# Patient Record
Sex: Female | Born: 1980
Health system: Southern US, Community
[De-identification: ages and names within clinical notes are randomized; demographics above are authoritative.]

## PROBLEM LIST (undated history)

## (undated) ENCOUNTER — Inpatient Hospital Stay (HOSPITAL_COMMUNITY): Payer: Self-pay

## (undated) DIAGNOSIS — O24419 Gestational diabetes mellitus in pregnancy, unspecified control: Secondary | ICD-10-CM

## (undated) DIAGNOSIS — O09529 Supervision of elderly multigravida, unspecified trimester: Secondary | ICD-10-CM

## (undated) DIAGNOSIS — Z789 Other specified health status: Secondary | ICD-10-CM

## (undated) HISTORY — DX: Gestational diabetes mellitus in pregnancy, unspecified control: O24.419

## (undated) HISTORY — DX: Supervision of elderly multigravida, unspecified trimester: O09.529

## (undated) HISTORY — PX: CHOLECYSTECTOMY: SHX55

---

## 2013-07-18 LAB — OB RESULTS CONSOLE RUBELLA ANTIBODY, IGM: Rubella: IMMUNE

## 2013-07-18 LAB — OB RESULTS CONSOLE HEPATITIS B SURFACE ANTIGEN: Hepatitis B Surface Ag: NEGATIVE

## 2013-07-18 LAB — OB RESULTS CONSOLE GC/CHLAMYDIA
Chlamydia: NEGATIVE
GC PROBE AMP, GENITAL: NEGATIVE

## 2013-07-18 LAB — OB RESULTS CONSOLE ABO/RH: RH Type: POSITIVE

## 2013-07-18 LAB — OB RESULTS CONSOLE ANTIBODY SCREEN: Antibody Screen: NEGATIVE

## 2013-07-18 LAB — OB RESULTS CONSOLE HIV ANTIBODY (ROUTINE TESTING): HIV: NONREACTIVE

## 2013-07-18 LAB — OB RESULTS CONSOLE RPR: RPR: NONREACTIVE

## 2013-07-24 ENCOUNTER — Inpatient Hospital Stay (HOSPITAL_COMMUNITY): Payer: BC Managed Care – PPO

## 2013-07-24 ENCOUNTER — Inpatient Hospital Stay (HOSPITAL_COMMUNITY)
Admission: AD | Admit: 2013-07-24 | Discharge: 2013-07-24 | Disposition: A | Discharge status: Home / Self Care | Payer: BC Managed Care – PPO | Source: Ambulatory Visit | Attending: Obstetrics & Gynecology | Admitting: Obstetrics & Gynecology

## 2013-07-24 ENCOUNTER — Encounter (HOSPITAL_COMMUNITY): Payer: Self-pay | Admitting: *Deleted

## 2013-07-24 DIAGNOSIS — O208 Other hemorrhage in early pregnancy: Secondary | ICD-10-CM | POA: Insufficient documentation

## 2013-07-24 DIAGNOSIS — R109 Unspecified abdominal pain: Secondary | ICD-10-CM | POA: Insufficient documentation

## 2013-07-24 DIAGNOSIS — M549 Dorsalgia, unspecified: Secondary | ICD-10-CM | POA: Insufficient documentation

## 2013-07-24 HISTORY — DX: Other specified health status: Z78.9

## 2013-07-24 LAB — CBC
MCH: 30.4 pg (ref 26.0–34.0)
Platelets: 260 10*3/uL (ref 150–400)
RBC: 3.65 MIL/uL — ABNORMAL LOW (ref 3.87–5.11)
RDW: 13.6 % (ref 11.5–15.5)
WBC: 8.3 10*3/uL (ref 4.0–10.5)

## 2013-07-24 LAB — URINE MICROSCOPIC-ADD ON

## 2013-07-24 LAB — URINALYSIS, ROUTINE W REFLEX MICROSCOPIC
Bilirubin Urine: NEGATIVE
Glucose, UA: NEGATIVE mg/dL
Leukocytes, UA: NEGATIVE
Nitrite: NEGATIVE
Specific Gravity, Urine: 1.015 (ref 1.005–1.030)
Urobilinogen, UA: 0.2 mg/dL (ref 0.0–1.0)
pH: 7 (ref 5.0–8.0)

## 2013-07-24 NOTE — MAU Note (Signed)
Bleeding started last night.  No blots, wearing a pad. Pain in rt side and low back

## 2013-07-24 NOTE — MAU Provider Note (Signed)
History     CSN: [id number]  Arrival date and time: 07/24/13 1002 Provider notified for order clarification: 0940 Provider on unit: 1240 Provider at bedside: 1245       Chief Complaint  Patient presents with  . Abdominal Pain  . Vaginal Bleeding   HPI  [name] is a 32 yo G3P2002 at 10.[redacted] wks gestation by ultrasound.  She presents today with complaints of bright, red bleeding since last night.  She reports soaking 1-2 pads this morning.  She became pregnant while on  Paraguard IUD; which has since been removed.  She has had 5 ultrasounds in this pregnancy with a dx'd [facility].   She has another ultrasound scheduled for 08/02/2013. Blood type is O+.  Past Medical History  Diagnosis Date  . Medical history non-contributory     Past Surgical History  Procedure Laterality Date  . No past surgeries      History reviewed. No pertinent family history.  History  Substance Use Topics  . Smoking status: Never Smoker   . Smokeless tobacco: Not on file  . Alcohol Use: No    Allergies: No Known Allergies  Prescriptions prior to admission  Medication Sig Dispense Refill  . acetaminophen (TYLENOL) 500 MG tablet Take 500 mg by mouth every 8 (eight) hours as needed for moderate pain.      . Prenatal Vit-Fe Fumarate-FA (PRENATAL MULTIVITAMIN) TABS tablet Take 1 tablet by mouth daily at 12 noon.        Review of Systems  Constitutional: Negative.   HENT: Negative.   Eyes: Negative.   Respiratory: Negative.   Cardiovascular: Negative.   Gastrointestinal: Negative.   Genitourinary: Negative.   Musculoskeletal: Negative.   Skin: Negative.   Neurological: Negative.   Endo/Heme/Allergies: Negative.   Psychiatric/Behavioral: Negative.    Results for orders placed during the hospital encounter of 07/24/13 (from the past 24 hour(s))  CBC     Status: Abnormal   Collection Time    07/24/13 10:15 AM      Result Value Range   WBC 8.3  4.0 - 10.5 K/uL   RBC 3.65 (*) 3.87 -  5.11 MIL/uL   Hemoglobin 11.1 (*) 12.0 - 15.0 g/dL   HCT 40.9 (*) 81.1 - 91.4 %   MCV 87.4  78.0 - 100.0 fL   MCH 30.4  26.0 - 34.0 pg   MCHC 34.8  30.0 - 36.0 g/dL   RDW 78.2  95.6 - 21.3 %   Platelets 260  150 - 400 K/uL  URINALYSIS, ROUTINE W REFLEX MICROSCOPIC     Status: Abnormal   Collection Time    07/24/13 10:18 AM      Result Value Range   Color, Urine YELLOW  YELLOW   APPearance CLEAR  CLEAR   Specific Gravity, Urine 1.015  1.005 - 1.030   pH 7.0  5.0 - 8.0   Glucose, UA NEGATIVE  NEGATIVE mg/dL   Hgb urine dipstick LARGE (*) NEGATIVE   Bilirubin Urine NEGATIVE  NEGATIVE   Ketones, ur NEGATIVE  NEGATIVE mg/dL   Protein, ur NEGATIVE  NEGATIVE mg/dL   Urobilinogen, UA 0.2  0.0 - 1.0 mg/dL   Nitrite NEGATIVE  NEGATIVE   Leukocytes, UA NEGATIVE  NEGATIVE  URINE MICROSCOPIC-ADD ON     Status: Abnormal   Collection Time    07/24/13 10:18 AM      Result Value Range   Squamous Epithelial / LPF FEW (*) RARE   RBC / HPF 0-2  <  3 RBC/hpf   Bacteria, UA RARE  RARE   US Ob Comp Less 14 Wks  07/24/2013   CLINICAL DATA:  Vaginal bleeding, back pain. Ten week 6 day pregnancy based on the last menstrual period.  EXAM: OBSTETRIC <14 WK ULTRASOUND  TECHNIQUE: Transabdominal ultrasound was performed for evaluation of the gestation as well as the maternal uterus and adnexal regions.  COMPARISON:  None.  FINDINGS: Intrauterine gestational sac: Visualized/normal in shape.  Yolk sac:  Visualized  Embryo:  Visualized  Cardiac Activity: Visualize  Heart Rate: 160 bpm  MSD:   mm    w     d  CRL:   46.1  mm   11 w 3 d                  [place] EDC: 02/09/2014  Maternal uterus/adnexae: Small subchorionic hemorrhage. Ovaries symmetric in size and echotexture. No adnexal masses. No free fluid.  IMPRESSION: Ten week 6 day pregnancy by last menstrual period. Fetal heart rate 160 beats per min. No acute maternal findings.   Electronically Signed   By: Charlett Nose M.D.   On: 07/24/2013 11:53   Physical Exam    Blood pressure 116/58, pulse 70, temperature 98.1 F (36.7 C), temperature source Oral, resp. rate 18, height 5' (1.524 m), weight 67.586 kg (149 lb), last menstrual period 05/04/2013.  Physical Exam  Constitutional: She is oriented to person, place, and time. She appears well-developed and well-nourished.  HENT:  Head: Normocephalic and atraumatic.  Eyes: EOM are normal. Pupils are equal, round, and reactive to light.  Neck: Normal range of motion. Neck supple.  Cardiovascular: Normal rate, regular rhythm and normal heart sounds.   Respiratory: Effort normal and breath sounds normal.  GI: Soft. Bowel sounds are normal.  Genitourinary: Vagina normal.  Musculoskeletal: Normal range of motion.  Neurological: She is alert and oriented to person, place, and time.  Skin: Skin is warm and dry.  Psychiatric: She has a normal mood and affect. Her behavior is normal. Judgment and thought content normal.    MAU Course  Procedures CBC OB US <14wks  Assessment and Plan  32 yo IUP @ 10.6 wks Subchorionic Hemorrhage - stable Back Pain  Bleeding Precautions reviewed with patient Instructed to call back if bleeding  Tylenol 1000 mg every 8 hours prn pain Keep scheduled appointment 08/02/2013  *Dr. Cherly Hensen notified of plan - agrees Kenard Gower, MSN, CNM 07/24/2013, 12:59 PM

## 2013-08-01 NOTE — L&D Delivery Note (Signed)
Delivery Note At 3:34 PM a viable female was delivered via  (Presentation: DOA ;  ).  APGAR:per nursing notes , ; weight .  pending Placenta status: spontaneous, complete , .  Cord: 3 VC with the following complications: none.  Cord pH: not indicated  Anesthesia:  none Episiotomy: no Lacerations: 1st degreee Suture Repair: 3.0 vicryl rapide - 2 figure of 8 sutures  Est. Blood Loss (mL): 300cc  Mom to postpartum.  Baby to Couplet care / Skin to Skin.  Nancy Campbell A. 02/07/2014, 3:47 PM

## 2013-11-27 ENCOUNTER — Encounter: Payer: BC Managed Care – PPO | Attending: Obstetrics

## 2013-11-27 VITALS — Ht 62.0 in | Wt 177.1 lb

## 2013-11-27 DIAGNOSIS — Z713 Dietary counseling and surveillance: Secondary | ICD-10-CM | POA: Insufficient documentation

## 2013-11-27 DIAGNOSIS — O99810 Abnormal glucose complicating pregnancy: Secondary | ICD-10-CM | POA: Insufficient documentation

## 2013-11-28 NOTE — Progress Notes (Signed)
  Patient was seen on 11/27/13 for Gestational Diabetes self-management class at the [facility]. The following learning objectives were met by the patient during this course:   States the definition of Gestational Diabetes  States why dietary management is important in controlling blood glucose  Describes the effects of carbohydrates on blood glucose levels  Demonstrates ability to create a balanced meal plan  Demonstrates carbohydrate counting   States when to check blood glucose levels  Demonstrates proper blood glucose monitoring techniques  States the effect of stress and exercise on blood glucose levels  States the importance of limiting caffeine and abstaining from alcohol and smoking  Plan:  Aim for 2 Carb Choices per meal (30 grams) +/- 1 either way for breakfast Aim for 3 Carb Choices per meal (45 grams) +/- 1 either way from lunch and dinner Aim for 1-2 Carbs per snack Begin reading food labels for Total Carbohydrate and sugar grams of foods Consider  increasing your activity level by walking daily as tolerated Begin checking BG before breakfast and 1-2 hours after first bit of breakfast, lunch and dinner after  as directed by MD  Take medication  as directed by MD  Blood glucose monitor given:  One Touch Ultra Mini Self Monitoring Kit Lot # [id number] X Exp:05/2014 Blood glucose reading: 69m/dl  Patient instructed to monitor glucose levels: FBS: 60 - <90 2 hour: <120  Patient received the following handouts:  Nutrition Diabetes and Pregnancy  Carbohydrate Counting List  Meal Planning worksheet  Patient will be seen for follow-up as needed.

## 2014-01-16 LAB — OB RESULTS CONSOLE GBS: GBS: NEGATIVE

## 2014-02-03 ENCOUNTER — Encounter (HOSPITAL_COMMUNITY): Payer: Self-pay | Admitting: *Deleted

## 2014-02-03 ENCOUNTER — Telehealth (HOSPITAL_COMMUNITY): Payer: Self-pay | Admitting: *Deleted

## 2014-02-03 NOTE — Telephone Encounter (Signed)
Preadmission screen  

## 2014-02-04 ENCOUNTER — Other Ambulatory Visit: Payer: Self-pay | Admitting: Obstetrics

## 2014-02-07 ENCOUNTER — Encounter (HOSPITAL_COMMUNITY): Payer: Self-pay

## 2014-02-07 ENCOUNTER — Inpatient Hospital Stay (HOSPITAL_COMMUNITY)
Admission: RE | Admit: 2014-02-07 | Discharge: 2014-02-09 | DRG: 775 | Disposition: A | Discharge status: Home / Self Care | Payer: BC Managed Care – PPO | Source: Ambulatory Visit | Attending: Obstetrics | Admitting: Obstetrics

## 2014-02-07 DIAGNOSIS — O24429 Gestational diabetes mellitus in childbirth, unspecified control: Secondary | ICD-10-CM | POA: Diagnosis not present

## 2014-02-07 DIAGNOSIS — O99814 Abnormal glucose complicating childbirth: Principal | ICD-10-CM | POA: Diagnosis present

## 2014-02-07 DIAGNOSIS — O9902 Anemia complicating childbirth: Secondary | ICD-10-CM | POA: Diagnosis present

## 2014-02-07 DIAGNOSIS — O09529 Supervision of elderly multigravida, unspecified trimester: Secondary | ICD-10-CM | POA: Diagnosis present

## 2014-02-07 DIAGNOSIS — Z8249 Family history of ischemic heart disease and other diseases of the circulatory system: Secondary | ICD-10-CM

## 2014-02-07 DIAGNOSIS — Z3490 Encounter for supervision of normal pregnancy, unspecified, unspecified trimester: Secondary | ICD-10-CM

## 2014-02-07 DIAGNOSIS — D649 Anemia, unspecified: Secondary | ICD-10-CM | POA: Diagnosis present

## 2014-02-07 LAB — CBC
HCT: 33.2 % — ABNORMAL LOW (ref 36.0–46.0)
Hemoglobin: 11.4 g/dL — ABNORMAL LOW (ref 12.0–15.0)
MCH: 31.8 pg (ref 26.0–34.0)
MCHC: 34.3 g/dL (ref 30.0–36.0)
MCV: 92.5 fL (ref 78.0–100.0)
Platelets: 182 10*3/uL (ref 150–400)
RBC: 3.59 MIL/uL — AB (ref 3.87–5.11)
RDW: 14.4 % (ref 11.5–15.5)
WBC: 8.3 10*3/uL (ref 4.0–10.5)

## 2014-02-07 LAB — RPR

## 2014-02-07 LAB — ABO/RH: ABO/RH(D): O POS

## 2014-02-07 LAB — TYPE AND SCREEN
ABO/RH(D): O POS
Antibody Screen: NEGATIVE

## 2014-02-07 LAB — GLUCOSE, CAPILLARY
Glucose-Capillary: 82 mg/dL (ref 70–99)
Glucose-Capillary: 93 mg/dL (ref 70–99)

## 2014-02-07 MED ORDER — TETANUS-DIPHTH-ACELL PERTUSSIS 5-2.5-18.5 LF-MCG/0.5 IM SUSP: 0.5000 mL | Freq: Once | INTRAMUSCULAR | Status: DC

## 2014-02-07 MED ORDER — IBUPROFEN 600 MG PO TABS
600.0000 mg | ORAL_TABLET | Freq: Four times a day (QID) | ORAL | Status: DC | PRN
  Administered 2014-02-07: 600 mg via ORAL
  Filled 2014-02-07: qty 1

## 2014-02-07 MED ORDER — OXYTOCIN BOLUS FROM INFUSION: 500.0000 mL | INTRAVENOUS | Status: DC

## 2014-02-07 MED ORDER — CITRIC ACID-SODIUM CITRATE 334-500 MG/5ML PO SOLN: 30.0000 mL | ORAL | Status: DC | PRN

## 2014-02-07 MED ORDER — LACTATED RINGERS IV SOLN
INTRAVENOUS | Status: DC
  Administered 2014-02-07: 1000 mL via INTRAVENOUS

## 2014-02-07 MED ORDER — FLEET ENEMA 7-19 GM/118ML RE ENEM: 1.0000 | ENEMA | Freq: Every day | RECTAL | Status: DC | PRN

## 2014-02-07 MED ORDER — OXYCODONE-ACETAMINOPHEN 5-325 MG PO TABS: 1.0000 | ORAL_TABLET | ORAL | Status: DC | PRN

## 2014-02-07 MED ORDER — ZOLPIDEM TARTRATE 5 MG PO TABS: 5.0000 mg | ORAL_TABLET | Freq: Every evening | ORAL | Status: DC | PRN

## 2014-02-07 MED ORDER — SIMETHICONE 80 MG PO CHEW: 80.0000 mg | CHEWABLE_TABLET | ORAL | Status: DC | PRN

## 2014-02-07 MED ORDER — LIDOCAINE HCL (PF) 1 % IJ SOLN
30.0000 mL | INTRAMUSCULAR | Status: DC | PRN
  Filled 2014-02-07: qty 30

## 2014-02-07 MED ORDER — BUTORPHANOL TARTRATE 1 MG/ML IJ SOLN
1.0000 mg | INTRAMUSCULAR | Status: DC | PRN
  Administered 2014-02-07: 2 mg via INTRAVENOUS
  Filled 2014-02-07: qty 2

## 2014-02-07 MED ORDER — OXYTOCIN 40 UNITS IN LACTATED RINGERS INFUSION - SIMPLE MED
1.0000 m[IU]/min | INTRAVENOUS | Status: DC
  Administered 2014-02-07: 8 m[IU]/min via INTRAVENOUS
  Administered 2014-02-07: 10 m[IU]/min via INTRAVENOUS
  Administered 2014-02-07: 4 m[IU]/min via INTRAVENOUS
  Administered 2014-02-07: 8 m[IU]/min via INTRAVENOUS
  Administered 2014-02-07: 6 m[IU]/min via INTRAVENOUS
  Administered 2014-02-07: 2 m[IU]/min via INTRAVENOUS
  Filled 2014-02-07: qty 1000

## 2014-02-07 MED ORDER — TERBUTALINE SULFATE 1 MG/ML IJ SOLN: 0.2500 mg | Freq: Once | INTRAMUSCULAR | Status: DC | PRN

## 2014-02-07 MED ORDER — ACETAMINOPHEN 325 MG PO TABS: 650.0000 mg | ORAL_TABLET | ORAL | Status: DC | PRN

## 2014-02-07 MED ORDER — SENNOSIDES-DOCUSATE SODIUM 8.6-50 MG PO TABS
2.0000 | ORAL_TABLET | ORAL | Status: DC
  Administered 2014-02-07 – 2014-02-08 (×2): 2 via ORAL
  Filled 2014-02-07 (×2): qty 2

## 2014-02-07 MED ORDER — BENZOCAINE-MENTHOL 20-0.5 % EX AERO
1.0000 | INHALATION_SPRAY | CUTANEOUS | Status: DC | PRN
Start: 2014-02-07 — End: 2014-02-09
  Administered 2014-02-07: 1 via TOPICAL
  Filled 2014-02-07: qty 56

## 2014-02-07 MED ORDER — LANOLIN HYDROUS EX OINT: TOPICAL_OINTMENT | CUTANEOUS | Status: DC | PRN

## 2014-02-07 MED ORDER — IBUPROFEN 600 MG PO TABS
600.0000 mg | ORAL_TABLET | Freq: Four times a day (QID) | ORAL | Status: DC
  Administered 2014-02-07 – 2014-02-09 (×7): 600 mg via ORAL
  Filled 2014-02-07 (×7): qty 1

## 2014-02-07 MED ORDER — OXYTOCIN 40 UNITS IN LACTATED RINGERS INFUSION - SIMPLE MED
62.5000 mL/h | INTRAVENOUS | Status: DC
Start: 2014-02-07 — End: 2014-02-07

## 2014-02-07 MED ORDER — WITCH HAZEL-GLYCERIN EX PADS
1.0000 "application " | MEDICATED_PAD | CUTANEOUS | Status: DC | PRN
  Administered 2014-02-08: 1 via TOPICAL

## 2014-02-07 MED ORDER — LACTATED RINGERS IV SOLN
500.0000 mL | INTRAVENOUS | Status: DC | PRN
Start: 2014-02-07 — End: 2014-02-07

## 2014-02-07 MED ORDER — OXYCODONE-ACETAMINOPHEN 5-325 MG PO TABS
1.0000 | ORAL_TABLET | ORAL | Status: DC | PRN
  Administered 2014-02-07 – 2014-02-09 (×4): 1 via ORAL
  Filled 2014-02-07 (×4): qty 1

## 2014-02-07 MED ORDER — DIPHENHYDRAMINE HCL 25 MG PO CAPS: 25.0000 mg | ORAL_CAPSULE | Freq: Four times a day (QID) | ORAL | Status: DC | PRN

## 2014-02-07 MED ORDER — ONDANSETRON HCL 4 MG PO TABS: 4.0000 mg | ORAL_TABLET | ORAL | Status: DC | PRN

## 2014-02-07 MED ORDER — BISACODYL 10 MG RE SUPP: 10.0000 mg | Freq: Every day | RECTAL | Status: DC | PRN

## 2014-02-07 MED ORDER — PRENATAL MULTIVITAMIN CH
1.0000 | ORAL_TABLET | Freq: Every day | ORAL | Status: DC
  Administered 2014-02-08 – 2014-02-09 (×2): 1 via ORAL
  Filled 2014-02-07 (×2): qty 1

## 2014-02-07 MED ORDER — ONDANSETRON HCL 4 MG/2ML IJ SOLN: 4.0000 mg | INTRAMUSCULAR | Status: DC | PRN

## 2014-02-07 MED ORDER — ONDANSETRON HCL 4 MG/2ML IJ SOLN: 4.0000 mg | Freq: Four times a day (QID) | INTRAMUSCULAR | Status: DC | PRN

## 2014-02-07 MED ORDER — DIBUCAINE 1 % RE OINT
1.0000 "application " | TOPICAL_OINTMENT | RECTAL | Status: DC | PRN
  Administered 2014-02-08: 1 via RECTAL
  Filled 2014-02-07 (×2): qty 28

## 2014-02-07 NOTE — H&P (Signed)
Nancy Campbell is a 33 y.o. G3P2002 at 534w1d presenting for IOL because of gestational diabete . Pt notes rare  contractions. Good fetal movement, No vaginal bleeding, not  leaking fluid   [facility] - Unplanned pregnancy. Conceived on an IUD which was removed at [redacted] weeks gestation - Subchorionic hemorrhage 5 cm at 12 weeks with early first trimester vaginal bleeding - Marginal cord insertion - Early fetal bilateral pyelectasis which resolved at 26 weeks -Anemia, on iron - Gestational diabetes. On glyburide 7.5 mg every morning, 5 mg every afternoon. Patient was compliant with 4 times a day blood sugar checks and third trimester fetal surveillance which remained reassuring - Group B strep negative - EFW 84th percentile at 35 weeks    Prenatal Transfer Tool  Maternal Diabetes: Yes:  Diabetes Type:  Insulin/Medication controlled Genetic Screening: Normal Maternal Ultrasounds/Referrals: Normal Fetal Ultrasounds or other Referrals:  None Maternal Substance Abuse:  No Significant Maternal Medications:  None Significant Maternal Lab Results: None     OB History   Grav Para Term Preterm Abortions TAB SAB Ect Mult Living   3 2 2       2      Past Medical History  Diagnosis Date  . Medical history non-contributory   . Gestational diabetes mellitus, antepartum   . AMA (advanced maternal age) multigravida 35+    Past Surgical History  Procedure Laterality Date  . Cholecystectomy     Family History: family history includes Cancer in her father; Hypertension in her mother. Social History:  reports that she has never smoked. She does not have any smokeless tobacco history on file. She reports that she does not drink alcohol or use illicit drugs.  Review of Systems - Negative except Discomfort of pregnancy and occasional contractions   Dilation: Fingertip Effacement (%): Thick Station: -3 Exam by:: Dherr rn Blood pressure 108/73, pulse 59, temperature  98.5 F (36.9 C), temperature source Oral, resp. rate 20, height 5\' 1"  (1.549 m), weight 78.472 kg (173 lb), last menstrual period 05/04/2013.  Physical Exam:  Gen: well appearing, no distress CV: RRR Pulm: CTAB Back: no CVAT Abd: gravid, NT, no RUQ pain LE: trace edema, equal bilaterally, non-tender   Prenatal labs: ABO, Rh: O/Positive/-- (12/18 0000) Antibody: Negative (12/18 0000) Rubella:  immune RPR: Nonreactive (12/18 0000)  HBsAg: Negative (12/18 0000)  HIV: Non-reactive (12/18 0000)  GBS: Negative (06/18 0000)  1 hr Glucola 140  Genetic screening nl NT, nl AFP Anatomy US nl   Assessment/Plan: 33 y.o. [id number] at 594w1d Gestational diabetes would recommend induction of labor now at 39 weeks Group B strep negative Planning IV pain medicines    Briselda Naval A. 02/07/2014, 11:22 AM

## 2014-02-08 LAB — GLUCOSE, CAPILLARY: Glucose-Capillary: 135 mg/dL — ABNORMAL HIGH (ref 70–99)

## 2014-02-08 NOTE — Progress Notes (Signed)
PPD #1- SVD  Subjective:   Reports feeling well, cramping with BF Tolerating po/ No nausea or vomiting Bleeding is light Pain controlled with Motrin Up ad lib / ambulatory / voiding without problems Newborn: breastfeeding  / Circumcision: unsure   Objective:   VS:  VS:  Filed Vitals:   02/07/14 1746 02/07/14 1910 02/07/14 2245 02/08/14 0508  BP: 91/55 93/52 98/58  109/70  Pulse: 58 80 66 65  Temp: 98.4 F (36.9 C) 98.6 F (37 C) 98.4 F (36.9 C) 98.5 F (36.9 C)  TempSrc: Oral Oral Oral Oral  Resp: 16 16 16 18   Height:      Weight:      SpO2:    97%    LABS:  Recent Labs  02/07/14 0724  WBC 8.3  HGB 11.4*  PLT 182   Blood type: --/--/O POS, O POS (07/10 0724) Rubella: Immune (12/18 0000)   I&O: Intake/Output     07/10 0701 - 07/11 0700 07/11 0701 - 07/12 0700   Blood 300    Total Output 300     Net -300            Physical Exam: Alert and oriented x3 Abdomen: soft, non-tender, non-distended  Fundus: firm, non-tender, 1/U-suspect distended bladder Perineum: Well approximated, no significant erythema, edema, or drainage; healing well. Lochia: small Extremities: No edema, no calf pain or tenderness    Assessment:  PPD # 1G3P3003/ S/P:induced vaginal, 1st degree laceration A2GDM, delivered  Doing well    Plan: Check FBS in am Percocet prn pain b/w Motrin doses Empty bladder frequently Continue routine post partum orders Anticipate D/C home tomorrow   Donette LarryBHAMBRI, Alayziah Tangeman, N MSN, CNM 02/08/2014, 9:52 AM

## 2014-02-08 NOTE — Lactation Note (Signed)
This note was copied from the chart of Nancy NigeriaLoubna Reis. Lactation Consultation Note P-3, BF her 1st & 2nd child for over a yr. Each. They are 5118yrs. Old and 9 yrs. Old. When I entered rm. Pt. Had baby swaddled, in craddle position, feeding on end of nipple. Baby on its back turning head to feed. BF basics reviewed, how feeding a newborn is slightly different than feeding an older child. Encouraged good body alignment and STS. Mom stated she couldn't latch on Rt. Nipple. Mom has LE edema. Everted nipples, Rt. Does slightly inverts when compressed. Able to obtain good latch in football positioning. Mom keeps pulling back on breast pulling nipple back. Encouraged not to do that she can break the latch. Needed reminded several times. Has good colostrum. Hand expression taught. Heard audible swallows.  Patient Name: Nancy Campbell ZOXWR'UToday's Date: 02/08/2014 Reason for consult: Initial assessment Mom encouraged to feed baby 8-12 times/24 hours and with feeding cues. Mom encouraged to feed baby w/feeding cues. WH/LC brochure given w/resources, support groups and LC services. Educated about newborn behavior. Encouraged to call for assistance if needed and to verify proper latch.Reviewed Baby & Me book's Breastfeeding Basics.    Maternal Data    Feeding Feeding Type: Breast Fed Length of feed: 10 min (still feeding)  LATCH Score/Interventions Latch: Grasps breast easily, tongue down, lips flanged, rhythmical sucking. Intervention(s): Skin to skin;Teach feeding cues;Waking techniques Intervention(s): Adjust position;Assist with latch;Breast massage;Breast compression  Audible Swallowing: A few with stimulation Intervention(s): Skin to skin;Hand expression Intervention(s): Skin to skin;Hand expression;Alternate breast massage  Type of Nipple: Everted at rest and after stimulation  Comfort (Breast/Nipple): Filling, red/small blisters or bruises, mild/mod discomfort  Problem noted:  Mild/Moderate discomfort Interventions (Mild/moderate discomfort): Hand massage;Hand expression  Hold (Positioning): Assistance needed to correctly position infant at breast and maintain latch. Intervention(s): Breastfeeding basics reviewed;Support Pillows;Position options;Skin to skin  LATCH Score: 7  Lactation Tools Discussed/Used     Consult Status Consult Status: Follow-up Date: 02/09/14 Follow-up type: In-patient    Charyl DancerCARVER, Breniyah Romm G 02/08/2014, 9:58 AM

## 2014-02-08 NOTE — Lactation Note (Signed)
This note was copied from the chart of Nancy Campbell. Lactation Consultation Note Comfort gels given for soreness, encourage to express colostrum and rub on nipples and obtain deep latch. Cheek to breast. Patient Name: Nancy Jonna ClarkLoubna Todt ZOXWR'UToday's Date: 02/08/2014 Reason for consult: Initial assessment   Maternal Data    Feeding Feeding Type: Breast Fed Length of feed: 10 min (still feeding)  LATCH Score/Interventions Latch: Grasps breast easily, tongue down, lips flanged, rhythmical sucking. Intervention(s): Skin to skin;Teach feeding cues;Waking techniques Intervention(s): Adjust position;Assist with latch;Breast massage;Breast compression  Audible Swallowing: A few with stimulation Intervention(s): Skin to skin;Hand expression Intervention(s): Skin to skin;Hand expression;Alternate breast massage  Type of Nipple: Everted at rest and after stimulation  Comfort (Breast/Nipple): Filling, red/small blisters or bruises, mild/mod discomfort  Problem noted: Mild/Moderate discomfort Interventions (Mild/moderate discomfort): Hand massage;Hand expression  Hold (Positioning): Assistance needed to correctly position infant at breast and maintain latch. Intervention(s): Breastfeeding basics reviewed;Support Pillows;Position options;Skin to skin  LATCH Score: 7  Lactation Tools Discussed/Used     Consult Status Consult Status: Follow-up Date: 02/09/14 Follow-up type: In-patient    Charyl DancerCARVER, Starlee Corralejo G 02/08/2014, 10:25 AM

## 2014-02-09 ENCOUNTER — Ambulatory Visit: Payer: Self-pay

## 2014-02-09 DIAGNOSIS — O24429 Gestational diabetes mellitus in childbirth, unspecified control: Secondary | ICD-10-CM | POA: Diagnosis not present

## 2014-02-09 LAB — GLUCOSE, CAPILLARY: GLUCOSE-CAPILLARY: 81 mg/dL (ref 70–99)

## 2014-02-09 MED ORDER — OXYCODONE-ACETAMINOPHEN 5-325 MG PO TABS: 1.0000 | ORAL_TABLET | ORAL | Status: DC | PRN

## 2014-02-09 MED ORDER — IBUPROFEN 600 MG PO TABS: 600.0000 mg | ORAL_TABLET | Freq: Four times a day (QID) | ORAL | Status: AC

## 2014-02-09 NOTE — Progress Notes (Signed)
PPD #2- SVD  Subjective:   Reports feeling well Tolerating po/ No nausea or vomiting Bleeding is light Pain controlled with Motrin and Percocet Up ad lib / ambulatory / voiding without problems Newborn: breastfeeding  / Circumcision: undecided   Objective:   VS: VS:  Filed Vitals:   02/07/14 2245 02/08/14 0508 02/08/14 1801 02/09/14 0547  BP: 98/58 109/70 101/62 96/56  Pulse: 66 65 61 62  Temp: 98.4 F (36.9 C) 98.5 F (36.9 C) 98.2 F (36.8 C) 98 F (36.7 C)  TempSrc: Oral Oral Oral Oral  Resp: 16 18 18 18   Height:      Weight:      SpO2:  97%      LABS:  Recent Labs  02/07/14 0724  WBC 8.3  HGB 11.4*  PLT 182   Blood type: --/--/O POS, O POS (07/10 0724) Rubella: Immune (12/18 0000)                I&O: Intake/Output     07/11 0701 - 07/12 0700 07/12 0701 - 07/13 0700   Blood     Total Output       Net              Physical Exam: Alert and oriented X3 Abdomen: soft, non-tender, non-distended  Fundus: firm, non-tender, U-1 Perineum: Well approximated, no significant erythema, edema, or drainage; healing well. Lochia: small Extremities: Trace BLE edema, no calf pain or tenderness    Assessment: PPD # 2 G3P3003/ S/P:induced vaginal, 1st degree laceration A2GDM, delivered-stable Doing well - stable for discharge home   Plan: Discharge home RX's:  Ibuprofen 600mg  po Q 6 hrs prn pain #30 Refill x 0 Percocet 5/325 1 to 2 po Q 4 hrs prn pain #30 Refill x 0 Routine pp visit in 6wks and glucose test 6-12 weeks [facility] booklet given    Donette LarryBHAMBRI, Noelene Gang, N MSN, CNM 02/09/2014, 10:50 AM

## 2014-02-09 NOTE — Lactation Note (Signed)
This note was copied from the chart of Nancy Campbell. Lactation Consultation Note  Baby just returned from circ and sleeping. Mother states her nipples are sore.  Reviewed applying ebm and has comfort gels on. Encouraged deep latch, lots of STS. Mother denies questions or problems. Encouraged mother to call if she needs further assistance.   Patient Name: Nancy Campbell ZOXWR'UToday's Date: 02/09/2014 Reason for consult: Follow-up assessment   Maternal Data Has patient been taught Hand Expression?: Yes  Feeding Feeding Type: Breast Fed Length of feed: 15 min  LATCH Score/Interventions Latch: Grasps breast easily, tongue down, lips flanged, rhythmical sucking. Intervention(s): Adjust position  Audible Swallowing: A few with stimulation  Type of Nipple: Everted at rest and after stimulation  Comfort (Breast/Nipple): Soft / non-tender  Problem noted: Mild/Moderate discomfort  Hold (Positioning): Assistance needed to correctly position infant at breast and maintain latch.  LATCH Score: 8  Lactation Tools Discussed/Used     Consult Status Consult Status: Follow-up Date: 02/03/14 Follow-up type: In-patient    Dahlia ByesBerkelhammer, Ruth [facility] 02/09/2014, 3:12 PM

## 2014-02-09 NOTE — Discharge Summary (Signed)
Obstetric Discharge Summary Reason for Admission: induction of labor Prenatal Course: first trimester SCH, marginal CI, fetal pyelectasis, anemia, A2GDM Intrapartum Procedures: spontaneous vaginal delivery and Pitocin, and epidural Postpartum Procedures: none Complications-Operative and Postpartum: first degree perineal laceration Hemoglobin  Date Value Ref Range Status  02/07/2014 11.4* 12.0 - 15.0 g/dL Final     HCT  Date Value Ref Range Status  02/07/2014 33.2* 36.0 - 46.0 % Final    Physical Exam:  General: alert and cooperative Lochia: appropriate Uterine Fundus: firm Incision: healing well, no significant drainage, no dehiscence, no significant erythema DVT Evaluation: No evidence of DVT seen on physical exam. Negative Homan's sign. No cords or calf tenderness. No significant calf/ankle edema.  Discharge Diagnoses: Term Pregnancy-delivered and A2GDM, delivered  Discharge Information: Date: 02/09/2014 Activity: pelvic rest Diet: routine Medications: PNV, Ibuprofen and Percocet Condition: stable Instructions: refer to practice specific booklet Discharge to: home Follow-up Information   [facility] Schedule an appointment as soon as possible for a visit in 6 weeks. (and glucose test in 6-12 weeks at office)    Specialty:  Obstetrics and Gynecology   Contact information:   [address] [place] [id number] [phone]       Newborn Data: Live born female on 02/07/14 Birth Weight: 7 lb 12 oz (3515 g) APGAR: 8, 9 Remains inpatient for observation   Abigaile Rossie, N 02/09/2014, 11:37 AM

## 2014-02-10 ENCOUNTER — Ambulatory Visit: Payer: Self-pay

## 2014-02-10 NOTE — Lactation Note (Signed)
This note was copied from the chart of Nancy Campbell. Lactation Consultation Note  Patient Name: Nancy Jonna ClarkLoubna Riede RUEAV'WToday's Date: 02/10/2014 Reason for consult: Follow-up assessment Mom reports baby is nursing well, denies questions or concerns. Engorgement care reviewed if needed. Advised of OP services.  Maternal Data    Feeding Feeding Type: Breast Milk Length of feed: 10 min  LATCH Score/Interventions                      Lactation Tools Discussed/Used     Consult Status Consult Status: Complete Date: 02/10/14 Follow-up type: In-patient    Alfred LevinsGranger, Ashlin Kreps Ann 02/10/2014, 10:33 AM

## 2014-06-02 ENCOUNTER — Encounter (HOSPITAL_COMMUNITY): Payer: Self-pay

## 2015-04-29 IMAGING — US US OB COMP LESS 14 WK
1 series · 14 of 28 positions shown · non-contrast
Comparison: None.

CLINICAL DATA: Vaginal bleeding, back pain. Ten week 6 day
pregnancy based on the last menstrual period.

EXAM:
OBSTETRIC <14 WK ULTRASOUND
TECHNIQUE: Transabdominal ultrasound was performed for evaluation of the
gestation as well as the maternal uterus and adnexal regions.

[Series 1: us ob comp less 14 wks · 30 acquisitions, 14 frames shown]
[im 2/30]
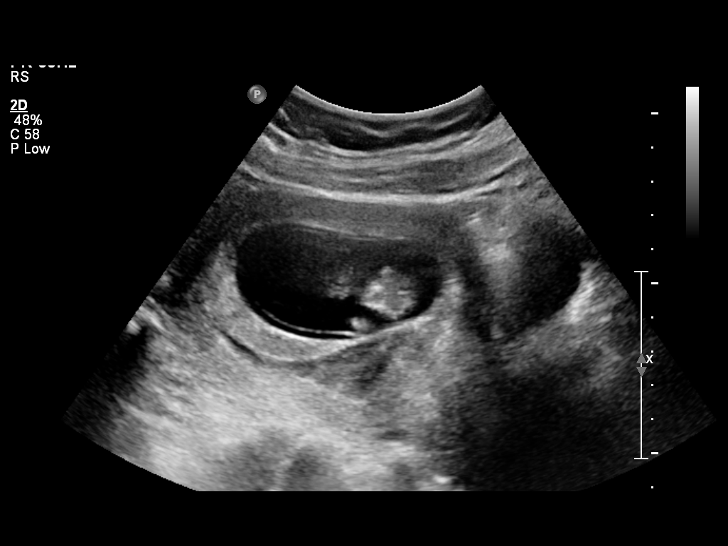
[im 4/30]
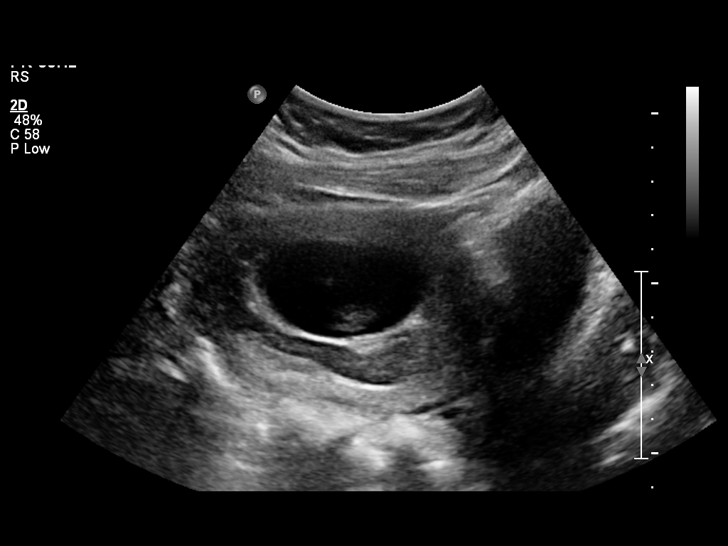
[im 6/30]
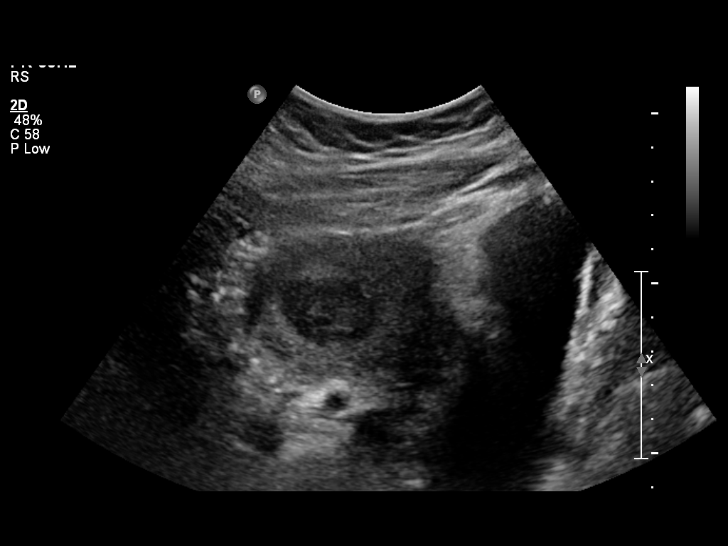
[im 8/30]
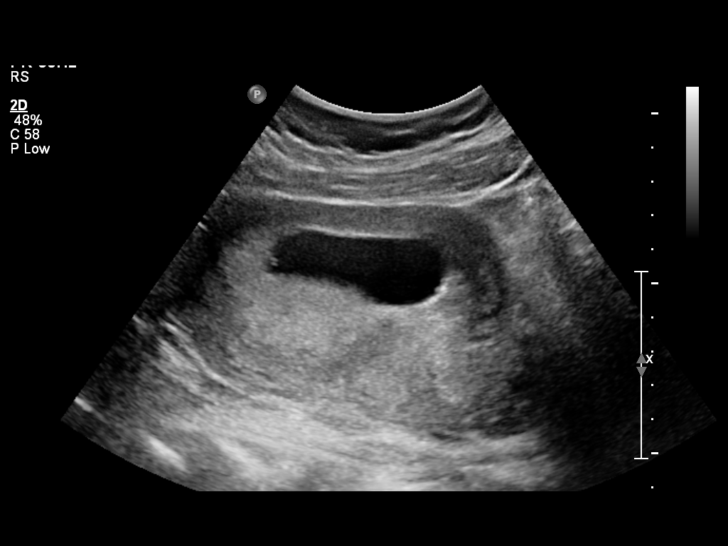
[im 10/30]
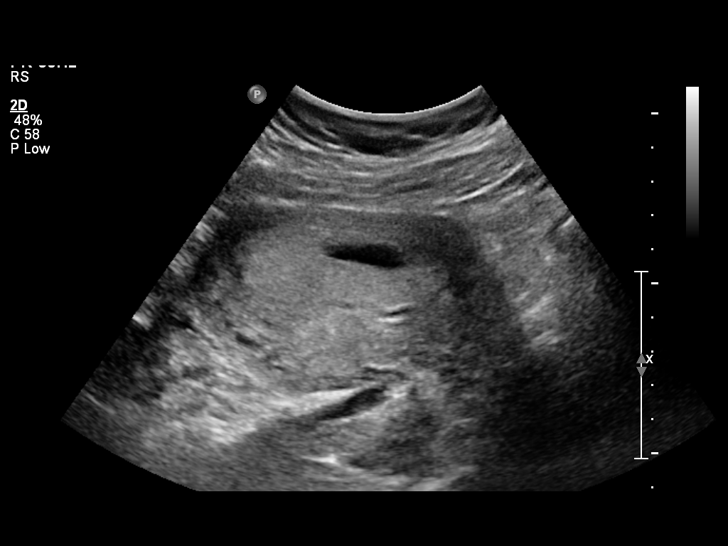
[im 12/30]
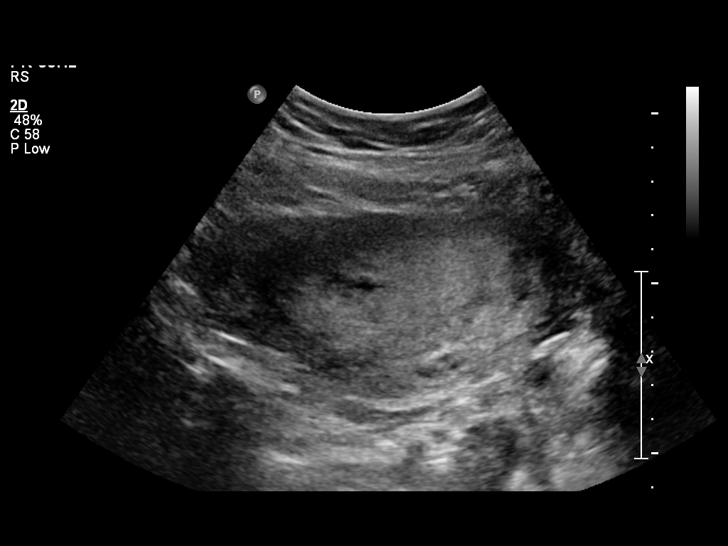
[im 14/30]
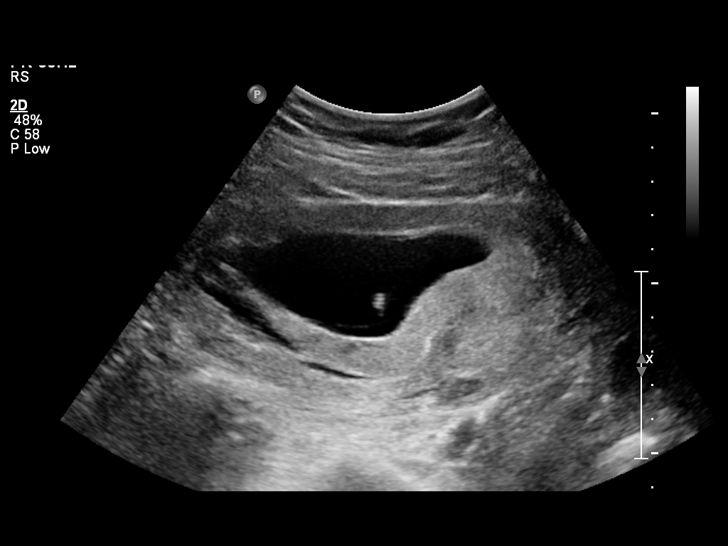
[im 17/30]
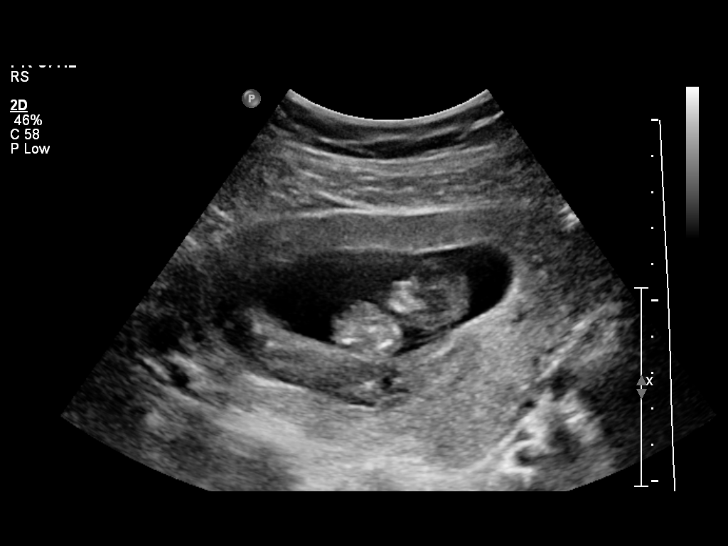
[im 19/30]
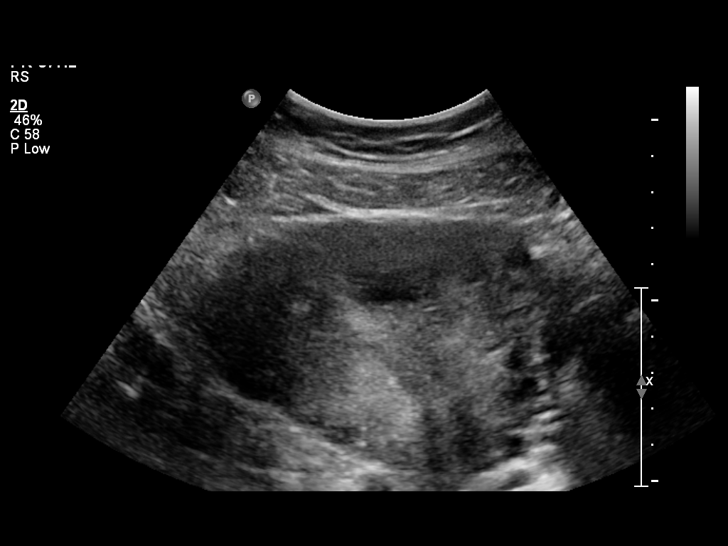
[im 21/30]
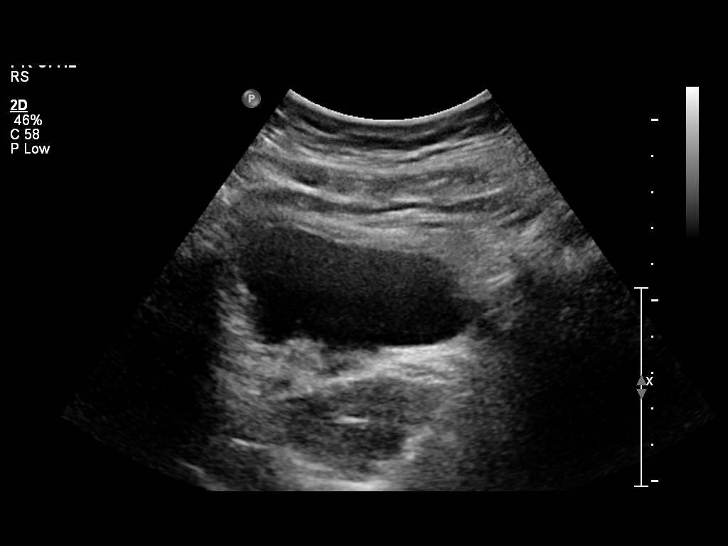
[im 23/30]
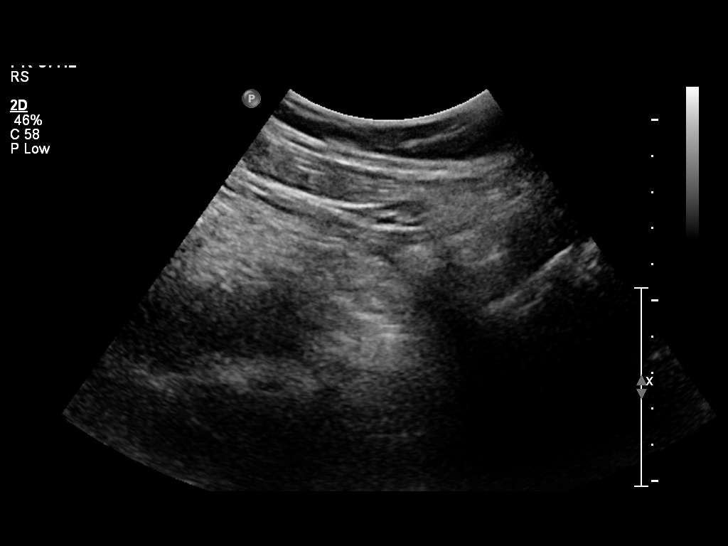
[im 25/30]
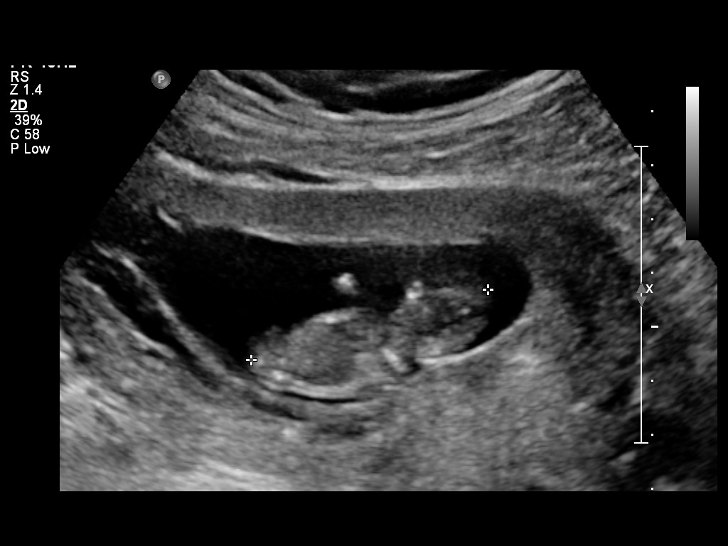
[im 27/30]
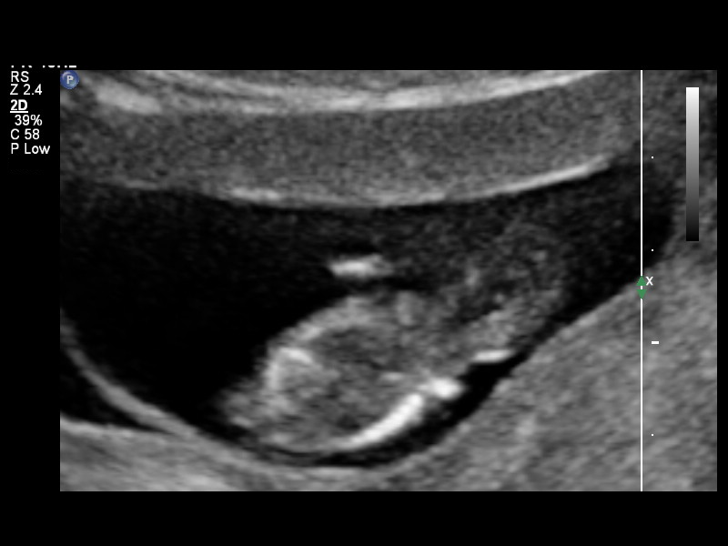
[im 30/30]
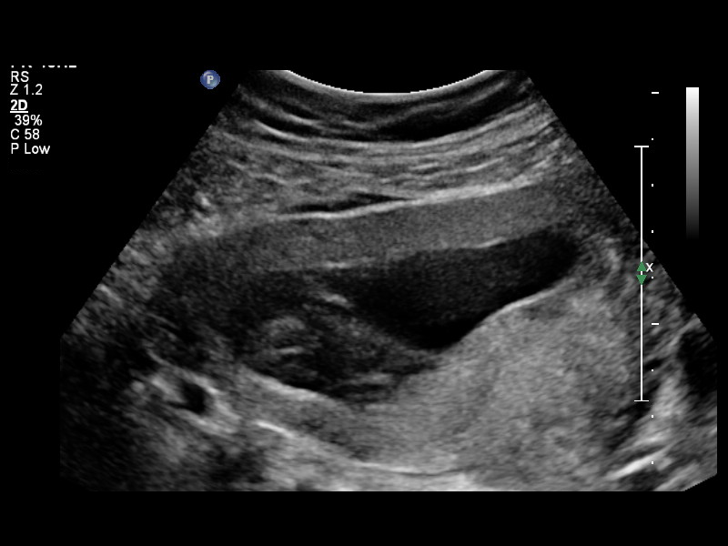

[14 of 28 positions shown; findings below may reference images not displayed]

FINDINGS: Intrauterine gestational sac: Visualized/normal in shape.

Yolk sac:  Visualized

Embryo:  Visualized

Cardiac Activity: Visualize

Heart Rate: 160 bpm

MSD:   mm    w     d

CRL:   46.1  mm   11 w 3 d                  US EDC: 02/09/2014

Maternal uterus/adnexae: Small subchorionic hemorrhage. Ovaries
symmetric in size and echotexture. No adnexal masses. No free fluid.
IMPRESSION: Ten week 6 day pregnancy by last menstrual period. Fetal heart rate
160 beats per min. No acute maternal findings.

## 2015-11-05 DIAGNOSIS — Z6823 Body mass index (BMI) 23.0-23.9, adult: Secondary | ICD-10-CM | POA: Diagnosis not present

## 2015-11-05 DIAGNOSIS — J019 Acute sinusitis, unspecified: Secondary | ICD-10-CM | POA: Diagnosis not present

## 2015-12-07 DIAGNOSIS — R42 Dizziness and giddiness: Secondary | ICD-10-CM | POA: Diagnosis not present

## 2015-12-07 DIAGNOSIS — Z6824 Body mass index (BMI) 24.0-24.9, adult: Secondary | ICD-10-CM | POA: Diagnosis not present

## 2015-12-15 DIAGNOSIS — Z30430 Encounter for insertion of intrauterine contraceptive device: Secondary | ICD-10-CM | POA: Diagnosis not present

## 2015-12-15 DIAGNOSIS — Z3202 Encounter for pregnancy test, result negative: Secondary | ICD-10-CM | POA: Diagnosis not present

## 2016-02-03 DIAGNOSIS — I499 Cardiac arrhythmia, unspecified: Secondary | ICD-10-CM | POA: Diagnosis not present

## 2016-02-15 DIAGNOSIS — L292 Pruritus vulvae: Secondary | ICD-10-CM | POA: Diagnosis not present

## 2016-02-15 DIAGNOSIS — Z30431 Encounter for routine checking of intrauterine contraceptive device: Secondary | ICD-10-CM | POA: Diagnosis not present

## 2016-02-15 DIAGNOSIS — N760 Acute vaginitis: Secondary | ICD-10-CM | POA: Diagnosis not present

## 2016-02-15 DIAGNOSIS — B373 Candidiasis of vulva and vagina: Secondary | ICD-10-CM | POA: Diagnosis not present

## 2016-02-16 DIAGNOSIS — I499 Cardiac arrhythmia, unspecified: Secondary | ICD-10-CM | POA: Diagnosis not present

## 2016-04-05 DIAGNOSIS — M940 Chondrocostal junction syndrome [Tietze]: Secondary | ICD-10-CM | POA: Diagnosis not present

## 2016-04-05 DIAGNOSIS — R0789 Other chest pain: Secondary | ICD-10-CM | POA: Diagnosis not present

## 2016-04-05 DIAGNOSIS — F419 Anxiety disorder, unspecified: Secondary | ICD-10-CM | POA: Diagnosis not present

## 2016-04-05 DIAGNOSIS — Z6824 Body mass index (BMI) 24.0-24.9, adult: Secondary | ICD-10-CM | POA: Diagnosis not present

## 2016-05-16 DIAGNOSIS — E663 Overweight: Secondary | ICD-10-CM | POA: Diagnosis not present

## 2016-05-16 DIAGNOSIS — R1011 Right upper quadrant pain: Secondary | ICD-10-CM | POA: Diagnosis not present

## 2016-05-16 DIAGNOSIS — Z6826 Body mass index (BMI) 26.0-26.9, adult: Secondary | ICD-10-CM | POA: Diagnosis not present

## 2016-05-20 DIAGNOSIS — R1011 Right upper quadrant pain: Secondary | ICD-10-CM | POA: Diagnosis not present

## 2016-05-31 ENCOUNTER — Encounter: Payer: Self-pay | Admitting: Gastroenterology

## 2016-05-31 ENCOUNTER — Encounter: Payer: Self-pay | Admitting: Physician Assistant

## 2016-05-31 ENCOUNTER — Ambulatory Visit (INDEPENDENT_AMBULATORY_CARE_PROVIDER_SITE_OTHER): Payer: Self-pay | Admitting: Physician Assistant

## 2016-05-31 VITALS — BP 96/56 | HR 77 | Ht 61.0 in | Wt 138.0 lb

## 2016-05-31 DIAGNOSIS — R1013 Epigastric pain: Secondary | ICD-10-CM | POA: Diagnosis not present

## 2016-05-31 DIAGNOSIS — M25511 Pain in right shoulder: Secondary | ICD-10-CM

## 2016-05-31 DIAGNOSIS — R1011 Right upper quadrant pain: Secondary | ICD-10-CM | POA: Diagnosis not present

## 2016-05-31 NOTE — Progress Notes (Signed)
Agree with assessment and plan as outlined.  

## 2016-05-31 NOTE — Patient Instructions (Addendum)
You have been scheduled for an endoscopy. Please follow written instructions given to you at your visit today. If you use inhalers (even only as needed), please bring them with you on the day of your procedure.   

## 2016-05-31 NOTE — Progress Notes (Signed)
Chief Complaint: RUQ pain  HPI:  Mrs. Nancy Campbell is a 35 year old female, who was referred to me by Hamrick, Durward FortesMaura L, MD for a complaint of right upper quadrant abdominal pain .  The patient has never been seen in our clinic before.  Per chart review patient had a recent right upper quadrant ultrasound completed on 05/20/16 which showed no gallstones or wall thickening. No sonographic Murphy sign. Common bile duct measured 5 mm within normal limits and there was no lesion on the liver.  Today, the patient presents to clinic accompanied by her 82-1/25-year-old son and husband, her husband does assist with history. The patient describes that 2 months ago she started with a right upper quadrant pain which occurs intermittently throughout the day and is unassociated with eating. She cannot recall exactly when this started or if there may have been a proceeding incident. When this starts it tends to be a 9-10/10 in severity and will radiate through to her back. It lasts for 15-20 minutes. This pain also wakes her from sleep. She describes that sometimes it "twitches like someone is poking me from the inside". This is somewhat worse if the patient lays on her left side to sleep at night and somewhat better if she lays on her right side. Nothing else seems to affect the pain.   Past medical history is significant for diagnosis of costochondritis about a month or so ago for which the patient used high-dose NSAIDs for a period of 10 days.  Patient's social history is positive for having a 1012-1/25-year-old son whom she does pick up on a daily basis, but she cannot remember a specific instance where she may have injured her shoulder  Patient denies nausea, vomiting, heartburn, reflux, dysphagia, increasing gas or bloating, change in bowel habits, fever, chills, blood in her stool, melena, change in diet, change in medications, recent increase in physical activity, weight loss, fatigue or anorexia.   Past Medical  History:  Diagnosis Date  . AMA (advanced maternal age) multigravida 35+   . Gestational diabetes mellitus, antepartum   . Medical history non-contributory     Past surgical history: None  Current Outpatient Prescriptions  Medication Sig Dispense Refill  . ibuprofen (ADVIL,MOTRIN) 600 MG tablet Take 1 tablet (600 mg total) by mouth every 6 (six) hours. 30 tablet 0  . Prenatal Vit-Fe Fumarate-FA (PRENATAL MULTIVITAMIN) TABS tablet Take 1 tablet by mouth daily at 12 noon.     No current facility-administered medications for this visit.     Allergies as of 05/31/2016  . (No Known Allergies)    Family History  Problem Relation Age of Onset  . Hypertension Mother   . Cancer Father     lung    Social History   Social History  . Marital status: Married    Spouse name: N/A  . Number of children: N/A  . Years of education: N/A   Occupational History  . Not on file.   Social History Main Topics  . Smoking status: Never Smoker  . Smokeless tobacco: Not on file  . Alcohol use No  . Drug use: No  . Sexual activity: Yes   Other Topics Concern  . Not on file   Social History Narrative  . No narrative on file    Review of Systems:     Constitutional: No weight loss, fever, chills, weakness or fatigue HEENT: Eyes: No change in vision  Ears, Nose, Throat:  No change in hearing or congestion Skin: No rash or itching Cardiovascular: No chest pain, chest pressure or palpitations   Respiratory: No SOB or cough Gastrointestinal: See HPI and otherwise negative Genitourinary: No dysuria or change in urinary frequency Neurological: No headache, dizziness or syncope Musculoskeletal: No new muscle or joint pain Hematologic: No bleeding or bruising Psychiatric: Positive for history of depression No history of anxiety    Physical Exam:  Vital signs: BP (!) 96/56   Pulse 77   Ht 5\' 1"  (1.549 m)   Wt 138 lb (62.6 kg)   BMI 26.07 kg/m   General:   Pleasant  Female appears to be in NAD, Well developed, Well nourished, alert and cooperative Head:  Normocephalic and atraumatic. Eyes:   PEERL, EOMI. No icterus. Conjunctiva pink. Ears:  Normal auditory acuity. Neck:  Supple Throat: Oral cavity and pharynx without inflammation, swelling or lesion. Teeth in good condition. Lungs: Respirations even and unlabored. Lungs clear to auscultation bilaterally.   No wheezes, crackles, or rhonchi.  Heart: Normal S1, S2. No MRG. Regular rate and rhythm. No peripheral edema, cyanosis or pallor.  Abdomen:  Soft, nondistended, mild epigastric/right upper quadrant tenderness to palpation. No rebound or guarding. Normal bowel sounds. No appreciable masses or hepatomegaly. Rectal:  Not performed.  Msk:  Symmetrical without gross deformities. Peripheral pulses intact. Patient tender over medial aspect of right shoulder blade upon palpation, worsening of pain with manipulation of her arm Extremities:  Without edema, no deformity or joint abnormality. Normal ROM, normal sensation. Neurologic:  Alert and  oriented x4;  grossly normal neurologically. CN II-XII intact.  Skin:   Dry and intact without significant lesions or rashes. Psychiatric: Oriented to person, place and time. Demonstrates good judgement and reason without abnormal affect or behaviors.  No recent labs or imaging  Assessment: 1. Right upper quadrant/epigastric pain: Started 2 months ago, worse at night while patient lays on her left side, unrelated to eating, no heartburn, reflux, nausea or vomiting; history of high-dose NSAID use a month ago, the patient tells me she was having this pain before ;consider gastritis versus upper GI etiology versus gallbladder versed musculoskeletal 2. Right shoulder pain: Initially patient told me this is related to times when she was in severe right upper quadrant abdominal pain, though on exam she is tender over her shoulder blade and her tenderness is worse when she  manipulated her arm and shoulder, believe this is musculoskeletal in origin, would not recommend NSAIDs at this time as there is discussion about a pop possible upper GI etiology for abdominal pain  Plan: 1. Discussed options for further evaluation with the patient and her husband. We discussed that she could have an EGD for further evaluation of a possible gastritis/upper GI etiology. She agreed to proceed. Discussed risks, benefits, limitations and alternatives. This is scheduled with Dr. Adela LankArmbruster. 2. If above is unrevealing, could consider further labs and possibly a HIDA scan. Patient did decline an empiric trial of low-dose PPI. 3. Discussed with the patient and her family that I believe her shoulder pain is musculoskeletal in origin. She should follow with her PCP regarding this and/or see a sports medicine doctor. 4. Patient to follow in clinic with Dr. Adela LankArmbruster per his recommendations after time of EGD.  Nancy MeekerJennifer Calel Pisarski, PA-C [facility] 05/31/2016, 11:28 AM  Cc: Ailene RavelHamrick, Maura L, MD

## 2016-06-14 ENCOUNTER — Encounter: Payer: BLUE CROSS/BLUE SHIELD | Admitting: Gastroenterology

## 2016-08-25 DIAGNOSIS — Z6828 Body mass index (BMI) 28.0-28.9, adult: Secondary | ICD-10-CM | POA: Diagnosis not present

## 2016-08-25 DIAGNOSIS — Z1389 Encounter for screening for other disorder: Secondary | ICD-10-CM | POA: Diagnosis not present

## 2016-08-25 DIAGNOSIS — Z2821 Immunization not carried out because of patient refusal: Secondary | ICD-10-CM | POA: Diagnosis not present

## 2016-08-25 DIAGNOSIS — M2528 Flail joint, other site: Secondary | ICD-10-CM | POA: Diagnosis not present

## 2016-11-29 DIAGNOSIS — R875 Abnormal microbiological findings in specimens from female genital organs: Secondary | ICD-10-CM | POA: Diagnosis not present

## 2016-11-29 DIAGNOSIS — Z0000 Encounter for general adult medical examination without abnormal findings: Secondary | ICD-10-CM | POA: Diagnosis not present

## 2016-11-29 DIAGNOSIS — N760 Acute vaginitis: Secondary | ICD-10-CM | POA: Diagnosis not present

## 2016-11-29 DIAGNOSIS — Z1151 Encounter for screening for human papillomavirus (HPV): Secondary | ICD-10-CM | POA: Diagnosis not present

## 2016-11-29 DIAGNOSIS — B373 Candidiasis of vulva and vagina: Secondary | ICD-10-CM | POA: Diagnosis not present

## 2016-11-29 DIAGNOSIS — Z01419 Encounter for gynecological examination (general) (routine) without abnormal findings: Secondary | ICD-10-CM | POA: Diagnosis not present

## 2016-11-29 DIAGNOSIS — Z6827 Body mass index (BMI) 27.0-27.9, adult: Secondary | ICD-10-CM | POA: Diagnosis not present

## 2016-11-29 DIAGNOSIS — Z130 Encounter for screening for diseases of the blood and blood-forming organs and certain disorders involving the immune mechanism: Secondary | ICD-10-CM | POA: Diagnosis not present

## 2016-11-29 DIAGNOSIS — L659 Nonscarring hair loss, unspecified: Secondary | ICD-10-CM | POA: Diagnosis not present

## 2016-11-29 DIAGNOSIS — Z1329 Encounter for screening for other suspected endocrine disorder: Secondary | ICD-10-CM | POA: Diagnosis not present

## 2016-11-29 DIAGNOSIS — N898 Other specified noninflammatory disorders of vagina: Secondary | ICD-10-CM | POA: Diagnosis not present

## 2016-11-29 DIAGNOSIS — Z131 Encounter for screening for diabetes mellitus: Secondary | ICD-10-CM | POA: Diagnosis not present

## 2016-12-06 DIAGNOSIS — L659 Nonscarring hair loss, unspecified: Secondary | ICD-10-CM | POA: Diagnosis not present

## 2016-12-06 DIAGNOSIS — R7309 Other abnormal glucose: Secondary | ICD-10-CM | POA: Diagnosis not present

## 2016-12-06 DIAGNOSIS — R7989 Other specified abnormal findings of blood chemistry: Secondary | ICD-10-CM | POA: Diagnosis not present

## 2017-01-18 DIAGNOSIS — H04122 Dry eye syndrome of left lacrimal gland: Secondary | ICD-10-CM | POA: Diagnosis not present

## 2017-03-06 DIAGNOSIS — F331 Major depressive disorder, recurrent, moderate: Secondary | ICD-10-CM | POA: Diagnosis not present

## 2017-03-08 DIAGNOSIS — R7989 Other specified abnormal findings of blood chemistry: Secondary | ICD-10-CM | POA: Diagnosis not present

## 2017-03-08 DIAGNOSIS — R7309 Other abnormal glucose: Secondary | ICD-10-CM | POA: Diagnosis not present

## 2017-05-03 DIAGNOSIS — Z6826 Body mass index (BMI) 26.0-26.9, adult: Secondary | ICD-10-CM | POA: Diagnosis not present

## 2017-05-03 DIAGNOSIS — Z111 Encounter for screening for respiratory tuberculosis: Secondary | ICD-10-CM | POA: Diagnosis not present

## 2017-05-03 DIAGNOSIS — Z0289 Encounter for other administrative examinations: Secondary | ICD-10-CM | POA: Diagnosis not present

## 2017-05-03 DIAGNOSIS — E663 Overweight: Secondary | ICD-10-CM | POA: Diagnosis not present

## 2017-05-12 DIAGNOSIS — F4323 Adjustment disorder with mixed anxiety and depressed mood: Secondary | ICD-10-CM | POA: Diagnosis not present

## 2017-05-12 DIAGNOSIS — Z6823 Body mass index (BMI) 23.0-23.9, adult: Secondary | ICD-10-CM | POA: Diagnosis not present

## 2017-07-27 DIAGNOSIS — F411 Generalized anxiety disorder: Secondary | ICD-10-CM | POA: Diagnosis not present

## 2017-09-06 DIAGNOSIS — M5430 Sciatica, unspecified side: Secondary | ICD-10-CM | POA: Diagnosis not present

## 2017-09-06 DIAGNOSIS — F411 Generalized anxiety disorder: Secondary | ICD-10-CM | POA: Diagnosis not present

## 2017-10-27 DIAGNOSIS — L659 Nonscarring hair loss, unspecified: Secondary | ICD-10-CM | POA: Diagnosis not present

## 2017-10-27 DIAGNOSIS — O24419 Gestational diabetes mellitus in pregnancy, unspecified control: Secondary | ICD-10-CM | POA: Diagnosis not present

## 2017-10-27 DIAGNOSIS — Z0000 Encounter for general adult medical examination without abnormal findings: Secondary | ICD-10-CM | POA: Diagnosis not present

## 2017-10-27 DIAGNOSIS — Z136 Encounter for screening for cardiovascular disorders: Secondary | ICD-10-CM | POA: Diagnosis not present

## 2017-10-27 DIAGNOSIS — M5430 Sciatica, unspecified side: Secondary | ICD-10-CM | POA: Diagnosis not present

## 2017-10-27 DIAGNOSIS — Z131 Encounter for screening for diabetes mellitus: Secondary | ICD-10-CM | POA: Diagnosis not present

## 2018-01-17 DIAGNOSIS — M79672 Pain in left foot: Secondary | ICD-10-CM | POA: Diagnosis not present

## 2018-01-23 ENCOUNTER — Encounter: Payer: Self-pay | Admitting: Sports Medicine

## 2018-01-23 ENCOUNTER — Ambulatory Visit: Payer: BLUE CROSS/BLUE SHIELD | Admitting: Sports Medicine

## 2018-01-23 ENCOUNTER — Ambulatory Visit (INDEPENDENT_AMBULATORY_CARE_PROVIDER_SITE_OTHER): Payer: BLUE CROSS/BLUE SHIELD

## 2018-01-23 DIAGNOSIS — M79672 Pain in left foot: Secondary | ICD-10-CM

## 2018-01-23 DIAGNOSIS — M779 Enthesopathy, unspecified: Secondary | ICD-10-CM

## 2018-01-23 MED ORDER — DICLOFENAC SODIUM 75 MG PO TBEC: 75.0000 mg | DELAYED_RELEASE_TABLET | Freq: Two times a day (BID) | ORAL | 0 refills | Status: AC

## 2018-01-23 MED ORDER — METHYLPREDNISOLONE 4 MG PO TBPK: ORAL_TABLET | ORAL | 0 refills | Status: AC

## 2018-01-23 NOTE — Progress Notes (Signed)
Subjective: Nancy Campbell is a 37 y.o. female patient who presents to office for evaluation of left foot pain. Patient complains of progressive pain especially over the last month in the left foot at medial arch. Ranks pain 5/10 and is now interferring with daily activities with swelling. Patient has tried massage with no relief in symptoms. Patient admits hip and thigh pain as well. Patient denies any other pedal complaints. Denies injury/trip/fall/sprain/any causative factors.   Review of Systems  Musculoskeletal: Positive for joint pain and myalgias.  All other systems reviewed and are negative.  Patient Active Problem List   Diagnosis Date Noted  . Gestational diabetes mellitus, delivered 02/09/2014  . Normal pregnancy 02/07/2014  . Postpartum care following vaginal delivery (7/10) 02/07/2014    Current Outpatient Medications on File Prior to Visit  Medication Sig Dispense Refill  . ibuprofen (ADVIL,MOTRIN) 600 MG tablet Take 1 tablet (600 mg total) by mouth every 6 (six) hours. 30 tablet 0  . Prenatal Vit-Fe Fumarate-FA (PRENATAL MULTIVITAMIN) TABS tablet Take 1 tablet by mouth daily at 12 noon.     No current facility-administered medications on file prior to visit.     No Known Allergies  Objective:  General: Alert and oriented x3 in no acute distress  Dermatology: No open lesions bilateral lower extremities, no webspace macerations, no ecchymosis bilateral, all nails x 10 are well manicured.  Vascular: Dorsalis Pedis and Posterior Tibial pedal pulses palpable, Capillary Fill Time 3 seconds,(+) pedal hair growth bilateral, no edema bilateral lower extremities, Temperature gradient within normal limits.  Neurology: Michaell CowingGross sensation intact via light touch bilateral.   Musculoskeletal: Mild tenderness with palpation at posterior tibial tendon on left foot. Strength within normal limits in all groups bilateral.   Gait: Antalgic gait  Xrays  Left Foot   Impression:Mild  soft tissue swelling. No other acute findings.   Assessment and Plan: Problem List Items Addressed This Visit    None    Visit Diagnoses    Tendonitis    -  Primary   Relevant Medications   methylPREDNISolone (MEDROL DOSEPAK) 4 MG TBPK tablet   diclofenac (VOLTAREN) 75 MG EC tablet   Foot pain, left       Relevant Orders   DG Foot Complete Left (Completed)      -Complete examination performed -Xrays reviewed -Discussed treatement options for PT tendonitis -Rx Medrol and diclofenac -Dispensed compression sleeve  -Dispensed ankle gaunlet  -Patient to return to office as needed in 4 weeks if pain is not better or sooner if condition worsens.  Asencion Islamitorya Sajad Glander, DPM

## 2018-05-17 DIAGNOSIS — B373 Candidiasis of vulva and vagina: Secondary | ICD-10-CM | POA: Diagnosis not present

## 2018-06-07 DIAGNOSIS — Z124 Encounter for screening for malignant neoplasm of cervix: Secondary | ICD-10-CM | POA: Diagnosis not present

## 2018-06-07 DIAGNOSIS — Z113 Encounter for screening for infections with a predominantly sexual mode of transmission: Secondary | ICD-10-CM | POA: Diagnosis not present

## 2018-06-07 DIAGNOSIS — Z1151 Encounter for screening for human papillomavirus (HPV): Secondary | ICD-10-CM | POA: Diagnosis not present

## 2018-06-07 DIAGNOSIS — Z6824 Body mass index (BMI) 24.0-24.9, adult: Secondary | ICD-10-CM | POA: Diagnosis not present

## 2018-06-07 DIAGNOSIS — Z01419 Encounter for gynecological examination (general) (routine) without abnormal findings: Secondary | ICD-10-CM | POA: Diagnosis not present

## 2018-06-18 DIAGNOSIS — R1031 Right lower quadrant pain: Secondary | ICD-10-CM | POA: Diagnosis not present

## 2018-06-18 DIAGNOSIS — Z975 Presence of (intrauterine) contraceptive device: Secondary | ICD-10-CM | POA: Diagnosis not present

## 2018-07-31 DIAGNOSIS — Y838 Other surgical procedures as the cause of abnormal reaction of the patient, or of later complication, without mention of misadventure at the time of the procedure: Secondary | ICD-10-CM | POA: Diagnosis not present

## 2018-07-31 DIAGNOSIS — T8332XA Displacement of intrauterine contraceptive device, initial encounter: Secondary | ICD-10-CM | POA: Diagnosis not present

## 2018-07-31 DIAGNOSIS — T8332XD Displacement of intrauterine contraceptive device, subsequent encounter: Secondary | ICD-10-CM | POA: Diagnosis not present

## 2019-05-28 DIAGNOSIS — D509 Iron deficiency anemia, unspecified: Secondary | ICD-10-CM | POA: Diagnosis not present

## 2019-05-28 DIAGNOSIS — E559 Vitamin D deficiency, unspecified: Secondary | ICD-10-CM | POA: Diagnosis not present

## 2019-05-28 DIAGNOSIS — R7303 Prediabetes: Secondary | ICD-10-CM | POA: Diagnosis not present

## 2019-05-28 DIAGNOSIS — Z0000 Encounter for general adult medical examination without abnormal findings: Secondary | ICD-10-CM | POA: Diagnosis not present

## 2019-05-28 DIAGNOSIS — Z111 Encounter for screening for respiratory tuberculosis: Secondary | ICD-10-CM | POA: Diagnosis not present

## 2019-05-28 DIAGNOSIS — Z13220 Encounter for screening for lipoid disorders: Secondary | ICD-10-CM | POA: Diagnosis not present

## 2019-10-30 DIAGNOSIS — M5432 Sciatica, left side: Secondary | ICD-10-CM | POA: Diagnosis not present

## 2019-10-30 DIAGNOSIS — Z6827 Body mass index (BMI) 27.0-27.9, adult: Secondary | ICD-10-CM | POA: Diagnosis not present

## 2019-11-11 DIAGNOSIS — Z30011 Encounter for initial prescription of contraceptive pills: Secondary | ICD-10-CM | POA: Diagnosis not present

## 2019-11-11 DIAGNOSIS — Z6827 Body mass index (BMI) 27.0-27.9, adult: Secondary | ICD-10-CM | POA: Diagnosis not present

## 2019-12-23 DIAGNOSIS — Z8669 Personal history of other diseases of the nervous system and sense organs: Secondary | ICD-10-CM | POA: Diagnosis not present

## 2019-12-23 DIAGNOSIS — H903 Sensorineural hearing loss, bilateral: Secondary | ICD-10-CM | POA: Diagnosis not present

## 2019-12-23 DIAGNOSIS — H9319 Tinnitus, unspecified ear: Secondary | ICD-10-CM | POA: Diagnosis not present

## 2020-01-05 DIAGNOSIS — Z1152 Encounter for screening for COVID-19: Secondary | ICD-10-CM | POA: Diagnosis not present

## 2020-01-05 DIAGNOSIS — Z20822 Contact with and (suspected) exposure to covid-19: Secondary | ICD-10-CM | POA: Diagnosis not present

## 2020-03-10 DIAGNOSIS — Z111 Encounter for screening for respiratory tuberculosis: Secondary | ICD-10-CM | POA: Diagnosis not present

## 2020-03-30 DIAGNOSIS — M2170 Unequal limb length (acquired), unspecified site: Secondary | ICD-10-CM | POA: Diagnosis not present

## 2020-03-30 DIAGNOSIS — R7303 Prediabetes: Secondary | ICD-10-CM | POA: Diagnosis not present

## 2020-03-30 DIAGNOSIS — E559 Vitamin D deficiency, unspecified: Secondary | ICD-10-CM | POA: Diagnosis not present

## 2020-03-30 DIAGNOSIS — D509 Iron deficiency anemia, unspecified: Secondary | ICD-10-CM | POA: Diagnosis not present

## 2020-03-30 DIAGNOSIS — R5383 Other fatigue: Secondary | ICD-10-CM | POA: Diagnosis not present

## 2020-04-02 DIAGNOSIS — M419 Scoliosis, unspecified: Secondary | ICD-10-CM | POA: Diagnosis not present

## 2020-04-02 DIAGNOSIS — M5432 Sciatica, left side: Secondary | ICD-10-CM | POA: Diagnosis not present

## 2020-04-02 DIAGNOSIS — M5137 Other intervertebral disc degeneration, lumbosacral region: Secondary | ICD-10-CM | POA: Diagnosis not present

## 2020-04-02 DIAGNOSIS — M5127 Other intervertebral disc displacement, lumbosacral region: Secondary | ICD-10-CM | POA: Diagnosis not present

## 2020-04-02 DIAGNOSIS — M5136 Other intervertebral disc degeneration, lumbar region: Secondary | ICD-10-CM | POA: Diagnosis not present

## 2020-04-17 ENCOUNTER — Ambulatory Visit: Payer: BLUE CROSS/BLUE SHIELD | Admitting: Sports Medicine

## 2020-04-30 DIAGNOSIS — Z20828 Contact with and (suspected) exposure to other viral communicable diseases: Secondary | ICD-10-CM | POA: Diagnosis not present

## 2020-05-04 DIAGNOSIS — Z20828 Contact with and (suspected) exposure to other viral communicable diseases: Secondary | ICD-10-CM | POA: Diagnosis not present

## 2020-05-08 DIAGNOSIS — Z20822 Contact with and (suspected) exposure to covid-19: Secondary | ICD-10-CM | POA: Diagnosis not present

## 2020-05-12 DIAGNOSIS — M2170 Unequal limb length (acquired), unspecified site: Secondary | ICD-10-CM | POA: Diagnosis not present

## 2020-05-13 DIAGNOSIS — Z20822 Contact with and (suspected) exposure to covid-19: Secondary | ICD-10-CM | POA: Diagnosis not present

## 2020-05-14 DIAGNOSIS — T7491XA Unspecified adult maltreatment, confirmed, initial encounter: Secondary | ICD-10-CM | POA: Diagnosis not present

## 2020-05-14 DIAGNOSIS — Z1331 Encounter for screening for depression: Secondary | ICD-10-CM | POA: Diagnosis not present

## 2020-05-14 DIAGNOSIS — Z6827 Body mass index (BMI) 27.0-27.9, adult: Secondary | ICD-10-CM | POA: Diagnosis not present

## 2020-05-14 DIAGNOSIS — F4323 Adjustment disorder with mixed anxiety and depressed mood: Secondary | ICD-10-CM | POA: Diagnosis not present

## 2020-05-18 DIAGNOSIS — Z20822 Contact with and (suspected) exposure to covid-19: Secondary | ICD-10-CM | POA: Diagnosis not present

## 2020-05-25 DIAGNOSIS — Z20822 Contact with and (suspected) exposure to covid-19: Secondary | ICD-10-CM | POA: Diagnosis not present

## 2020-06-01 DIAGNOSIS — Z20822 Contact with and (suspected) exposure to covid-19: Secondary | ICD-10-CM | POA: Diagnosis not present

## 2020-06-04 DIAGNOSIS — M2170 Unequal limb length (acquired), unspecified site: Secondary | ICD-10-CM | POA: Diagnosis not present

## 2020-06-08 DIAGNOSIS — Z20822 Contact with and (suspected) exposure to covid-19: Secondary | ICD-10-CM | POA: Diagnosis not present

## 2020-06-16 DIAGNOSIS — Z20822 Contact with and (suspected) exposure to covid-19: Secondary | ICD-10-CM | POA: Diagnosis not present

## 2020-06-17 DIAGNOSIS — S0081XA Abrasion of other part of head, initial encounter: Secondary | ICD-10-CM | POA: Diagnosis not present

## 2020-06-17 DIAGNOSIS — Z6827 Body mass index (BMI) 27.0-27.9, adult: Secondary | ICD-10-CM | POA: Diagnosis not present

## 2020-06-17 DIAGNOSIS — F4323 Adjustment disorder with mixed anxiety and depressed mood: Secondary | ICD-10-CM | POA: Diagnosis not present

## 2020-06-30 DIAGNOSIS — M2170 Unequal limb length (acquired), unspecified site: Secondary | ICD-10-CM | POA: Diagnosis not present

## 2020-07-21 DIAGNOSIS — M545 Low back pain, unspecified: Secondary | ICD-10-CM | POA: Diagnosis not present

## 2020-07-21 DIAGNOSIS — M7060 Trochanteric bursitis, unspecified hip: Secondary | ICD-10-CM | POA: Diagnosis not present

## 2020-07-21 DIAGNOSIS — M2170 Unequal limb length (acquired), unspecified site: Secondary | ICD-10-CM | POA: Diagnosis not present

## 2020-08-04 DIAGNOSIS — R059 Cough, unspecified: Secondary | ICD-10-CM | POA: Diagnosis not present

## 2020-08-04 DIAGNOSIS — R6889 Other general symptoms and signs: Secondary | ICD-10-CM | POA: Diagnosis not present

## 2020-08-04 DIAGNOSIS — Z20822 Contact with and (suspected) exposure to covid-19: Secondary | ICD-10-CM | POA: Diagnosis not present

## 2020-09-13 DIAGNOSIS — Z03818 Encounter for observation for suspected exposure to other biological agents ruled out: Secondary | ICD-10-CM | POA: Diagnosis not present

## 2020-09-20 DIAGNOSIS — Z03818 Encounter for observation for suspected exposure to other biological agents ruled out: Secondary | ICD-10-CM | POA: Diagnosis not present

## 2020-10-08 DIAGNOSIS — R7303 Prediabetes: Secondary | ICD-10-CM | POA: Diagnosis not present

## 2020-10-08 DIAGNOSIS — E559 Vitamin D deficiency, unspecified: Secondary | ICD-10-CM | POA: Diagnosis not present

## 2020-10-08 DIAGNOSIS — Z6828 Body mass index (BMI) 28.0-28.9, adult: Secondary | ICD-10-CM | POA: Diagnosis not present

## 2020-10-08 DIAGNOSIS — N938 Other specified abnormal uterine and vaginal bleeding: Secondary | ICD-10-CM | POA: Diagnosis not present

## 2020-10-14 DIAGNOSIS — B373 Candidiasis of vulva and vagina: Secondary | ICD-10-CM | POA: Diagnosis not present

## 2020-10-14 DIAGNOSIS — R1032 Left lower quadrant pain: Secondary | ICD-10-CM | POA: Diagnosis not present

## 2020-10-14 DIAGNOSIS — N920 Excessive and frequent menstruation with regular cycle: Secondary | ICD-10-CM | POA: Diagnosis not present

## 2020-10-14 DIAGNOSIS — R1031 Right lower quadrant pain: Secondary | ICD-10-CM | POA: Diagnosis not present

## 2021-01-08 DIAGNOSIS — Z1152 Encounter for screening for COVID-19: Secondary | ICD-10-CM | POA: Diagnosis not present

## 2021-01-08 DIAGNOSIS — Z20822 Contact with and (suspected) exposure to covid-19: Secondary | ICD-10-CM | POA: Diagnosis not present

## 2021-03-16 DIAGNOSIS — R5383 Other fatigue: Secondary | ICD-10-CM | POA: Diagnosis not present

## 2021-03-16 DIAGNOSIS — E559 Vitamin D deficiency, unspecified: Secondary | ICD-10-CM | POA: Diagnosis not present

## 2021-03-16 DIAGNOSIS — F4323 Adjustment disorder with mixed anxiety and depressed mood: Secondary | ICD-10-CM | POA: Diagnosis not present

## 2021-03-16 DIAGNOSIS — Z6829 Body mass index (BMI) 29.0-29.9, adult: Secondary | ICD-10-CM | POA: Diagnosis not present

## 2021-03-17 DIAGNOSIS — B373 Candidiasis of vulva and vagina: Secondary | ICD-10-CM | POA: Diagnosis not present

## 2021-03-17 DIAGNOSIS — N898 Other specified noninflammatory disorders of vagina: Secondary | ICD-10-CM | POA: Diagnosis not present

## 2021-03-17 DIAGNOSIS — B9689 Other specified bacterial agents as the cause of diseases classified elsewhere: Secondary | ICD-10-CM | POA: Diagnosis not present

## 2021-03-17 DIAGNOSIS — N760 Acute vaginitis: Secondary | ICD-10-CM | POA: Diagnosis not present

## 2021-04-08 DIAGNOSIS — M545 Low back pain, unspecified: Secondary | ICD-10-CM | POA: Diagnosis not present

## 2021-04-08 DIAGNOSIS — M7060 Trochanteric bursitis, unspecified hip: Secondary | ICD-10-CM | POA: Diagnosis not present

## 2021-05-10 DIAGNOSIS — I878 Other specified disorders of veins: Secondary | ICD-10-CM | POA: Diagnosis not present

## 2021-05-10 DIAGNOSIS — F4323 Adjustment disorder with mixed anxiety and depressed mood: Secondary | ICD-10-CM | POA: Diagnosis not present

## 2021-05-10 DIAGNOSIS — K5909 Other constipation: Secondary | ICD-10-CM | POA: Diagnosis not present

## 2021-05-10 DIAGNOSIS — Z0000 Encounter for general adult medical examination without abnormal findings: Secondary | ICD-10-CM | POA: Diagnosis not present

## 2021-05-19 DIAGNOSIS — N760 Acute vaginitis: Secondary | ICD-10-CM | POA: Diagnosis not present

## 2021-05-20 DIAGNOSIS — B3731 Acute candidiasis of vulva and vagina: Secondary | ICD-10-CM | POA: Diagnosis not present

## 2021-06-17 DIAGNOSIS — M79641 Pain in right hand: Secondary | ICD-10-CM | POA: Diagnosis not present

## 2021-06-17 DIAGNOSIS — Z6829 Body mass index (BMI) 29.0-29.9, adult: Secondary | ICD-10-CM | POA: Diagnosis not present

## 2021-07-15 DIAGNOSIS — M25541 Pain in joints of right hand: Secondary | ICD-10-CM | POA: Diagnosis not present

## 2021-09-28 DIAGNOSIS — R5383 Other fatigue: Secondary | ICD-10-CM | POA: Diagnosis not present

## 2021-09-28 DIAGNOSIS — R7303 Prediabetes: Secondary | ICD-10-CM | POA: Diagnosis not present

## 2021-09-28 DIAGNOSIS — E559 Vitamin D deficiency, unspecified: Secondary | ICD-10-CM | POA: Diagnosis not present

## 2021-09-28 DIAGNOSIS — F4323 Adjustment disorder with mixed anxiety and depressed mood: Secondary | ICD-10-CM | POA: Diagnosis not present

## 2021-11-01 DIAGNOSIS — R7303 Prediabetes: Secondary | ICD-10-CM | POA: Diagnosis not present

## 2021-11-01 DIAGNOSIS — F4323 Adjustment disorder with mixed anxiety and depressed mood: Secondary | ICD-10-CM | POA: Diagnosis not present

## 2022-02-14 DIAGNOSIS — M25552 Pain in left hip: Secondary | ICD-10-CM | POA: Diagnosis not present

## 2022-02-17 DIAGNOSIS — M1612 Unilateral primary osteoarthritis, left hip: Secondary | ICD-10-CM | POA: Diagnosis not present

## 2022-02-17 DIAGNOSIS — M7989 Other specified soft tissue disorders: Secondary | ICD-10-CM | POA: Diagnosis not present

## 2022-02-17 DIAGNOSIS — M25552 Pain in left hip: Secondary | ICD-10-CM | POA: Diagnosis not present

## 2022-02-17 DIAGNOSIS — M25452 Effusion, left hip: Secondary | ICD-10-CM | POA: Diagnosis not present

## 2022-02-21 DIAGNOSIS — M1612 Unilateral primary osteoarthritis, left hip: Secondary | ICD-10-CM | POA: Diagnosis not present

## 2022-02-24 DIAGNOSIS — N760 Acute vaginitis: Secondary | ICD-10-CM | POA: Diagnosis not present

## 2022-02-24 DIAGNOSIS — Z1231 Encounter for screening mammogram for malignant neoplasm of breast: Secondary | ICD-10-CM | POA: Diagnosis not present

## 2022-03-29 DIAGNOSIS — R928 Other abnormal and inconclusive findings on diagnostic imaging of breast: Secondary | ICD-10-CM | POA: Diagnosis not present

## 2022-03-29 DIAGNOSIS — Z1231 Encounter for screening mammogram for malignant neoplasm of breast: Secondary | ICD-10-CM | POA: Diagnosis not present

## 2022-04-25 DIAGNOSIS — R0609 Other forms of dyspnea: Secondary | ICD-10-CM | POA: Diagnosis not present

## 2022-04-25 DIAGNOSIS — R079 Chest pain, unspecified: Secondary | ICD-10-CM | POA: Diagnosis not present

## 2022-04-25 DIAGNOSIS — R9431 Abnormal electrocardiogram [ECG] [EKG]: Secondary | ICD-10-CM | POA: Diagnosis not present

## 2022-04-25 DIAGNOSIS — M7989 Other specified soft tissue disorders: Secondary | ICD-10-CM | POA: Diagnosis not present

## 2022-04-27 DIAGNOSIS — N6489 Other specified disorders of breast: Secondary | ICD-10-CM | POA: Diagnosis not present

## 2022-04-27 DIAGNOSIS — N6311 Unspecified lump in the right breast, upper outer quadrant: Secondary | ICD-10-CM | POA: Diagnosis not present

## 2022-04-27 DIAGNOSIS — R922 Inconclusive mammogram: Secondary | ICD-10-CM | POA: Diagnosis not present

## 2022-05-02 DIAGNOSIS — R7303 Prediabetes: Secondary | ICD-10-CM | POA: Diagnosis not present

## 2022-05-02 DIAGNOSIS — R5382 Chronic fatigue, unspecified: Secondary | ICD-10-CM | POA: Diagnosis not present

## 2022-05-02 DIAGNOSIS — T7491XA Unspecified adult maltreatment, confirmed, initial encounter: Secondary | ICD-10-CM | POA: Diagnosis not present

## 2022-05-02 DIAGNOSIS — F4323 Adjustment disorder with mixed anxiety and depressed mood: Secondary | ICD-10-CM | POA: Diagnosis not present

## 2022-05-26 DIAGNOSIS — N644 Mastodynia: Secondary | ICD-10-CM | POA: Diagnosis not present

## 2022-05-31 DIAGNOSIS — Z3009 Encounter for other general counseling and advice on contraception: Secondary | ICD-10-CM | POA: Diagnosis not present

## 2022-07-05 DIAGNOSIS — N939 Abnormal uterine and vaginal bleeding, unspecified: Secondary | ICD-10-CM | POA: Diagnosis not present

## 2022-09-07 DIAGNOSIS — J020 Streptococcal pharyngitis: Secondary | ICD-10-CM | POA: Diagnosis not present

## 2022-09-07 DIAGNOSIS — R6889 Other general symptoms and signs: Secondary | ICD-10-CM | POA: Diagnosis not present

## 2022-10-27 DIAGNOSIS — N6489 Other specified disorders of breast: Secondary | ICD-10-CM | POA: Diagnosis not present

## 2022-10-27 DIAGNOSIS — N6313 Unspecified lump in the right breast, lower outer quadrant: Secondary | ICD-10-CM | POA: Diagnosis not present

## 2022-11-01 DIAGNOSIS — E559 Vitamin D deficiency, unspecified: Secondary | ICD-10-CM | POA: Diagnosis not present

## 2022-11-01 DIAGNOSIS — T7491XA Unspecified adult maltreatment, confirmed, initial encounter: Secondary | ICD-10-CM | POA: Diagnosis not present

## 2022-11-01 DIAGNOSIS — F4323 Adjustment disorder with mixed anxiety and depressed mood: Secondary | ICD-10-CM | POA: Diagnosis not present

## 2022-11-01 DIAGNOSIS — R5382 Chronic fatigue, unspecified: Secondary | ICD-10-CM | POA: Diagnosis not present

## 2022-11-01 DIAGNOSIS — R7303 Prediabetes: Secondary | ICD-10-CM | POA: Diagnosis not present

## 2022-11-03 DIAGNOSIS — N898 Other specified noninflammatory disorders of vagina: Secondary | ICD-10-CM | POA: Diagnosis not present

## 2022-11-23 DIAGNOSIS — Z3689 Encounter for other specified antenatal screening: Secondary | ICD-10-CM | POA: Diagnosis not present

## 2022-11-23 DIAGNOSIS — Z3200 Encounter for pregnancy test, result unknown: Secondary | ICD-10-CM | POA: Diagnosis not present

## 2022-12-02 DIAGNOSIS — E673 Hypervitaminosis D: Secondary | ICD-10-CM | POA: Diagnosis not present

## 2022-12-27 DIAGNOSIS — F4323 Adjustment disorder with mixed anxiety and depressed mood: Secondary | ICD-10-CM | POA: Diagnosis not present

## 2022-12-28 DIAGNOSIS — Z01419 Encounter for gynecological examination (general) (routine) without abnormal findings: Secondary | ICD-10-CM | POA: Diagnosis not present

## 2023-01-09 DIAGNOSIS — F4323 Adjustment disorder with mixed anxiety and depressed mood: Secondary | ICD-10-CM | POA: Diagnosis not present

## 2023-02-07 DIAGNOSIS — Z111 Encounter for screening for respiratory tuberculosis: Secondary | ICD-10-CM | POA: Diagnosis not present

## 2023-02-20 DIAGNOSIS — F4323 Adjustment disorder with mixed anxiety and depressed mood: Secondary | ICD-10-CM | POA: Diagnosis not present

## 2023-02-24 DIAGNOSIS — R079 Chest pain, unspecified: Secondary | ICD-10-CM | POA: Diagnosis not present

## 2023-03-15 DIAGNOSIS — Z0000 Encounter for general adult medical examination without abnormal findings: Secondary | ICD-10-CM | POA: Diagnosis not present

## 2023-03-15 DIAGNOSIS — R7303 Prediabetes: Secondary | ICD-10-CM | POA: Diagnosis not present

## 2023-03-15 DIAGNOSIS — K5909 Other constipation: Secondary | ICD-10-CM | POA: Diagnosis not present

## 2023-03-15 DIAGNOSIS — F4323 Adjustment disorder with mixed anxiety and depressed mood: Secondary | ICD-10-CM | POA: Diagnosis not present

## 2023-05-10 DIAGNOSIS — R7303 Prediabetes: Secondary | ICD-10-CM | POA: Diagnosis not present

## 2023-05-10 DIAGNOSIS — E559 Vitamin D deficiency, unspecified: Secondary | ICD-10-CM | POA: Diagnosis not present

## 2023-05-10 DIAGNOSIS — T7491XA Unspecified adult maltreatment, confirmed, initial encounter: Secondary | ICD-10-CM | POA: Diagnosis not present

## 2023-05-10 DIAGNOSIS — F4323 Adjustment disorder with mixed anxiety and depressed mood: Secondary | ICD-10-CM | POA: Diagnosis not present

## 2023-06-14 DIAGNOSIS — M5432 Sciatica, left side: Secondary | ICD-10-CM | POA: Diagnosis not present

## 2023-07-19 DIAGNOSIS — R928 Other abnormal and inconclusive findings on diagnostic imaging of breast: Secondary | ICD-10-CM | POA: Diagnosis not present

## 2023-07-19 DIAGNOSIS — N6313 Unspecified lump in the right breast, lower outer quadrant: Secondary | ICD-10-CM | POA: Diagnosis not present

## 2023-07-24 DIAGNOSIS — S63609A Unspecified sprain of unspecified thumb, initial encounter: Secondary | ICD-10-CM | POA: Diagnosis not present

## 2023-07-24 DIAGNOSIS — Z3202 Encounter for pregnancy test, result negative: Secondary | ICD-10-CM | POA: Diagnosis not present
